# Patient Record
Sex: Male | Born: 1980 | Race: White | Hispanic: No | Marital: Married | Smoking: Former smoker
Health system: Southern US, Community
[De-identification: ages and names within clinical notes are randomized; demographics above are authoritative.]

## PROBLEM LIST (undated history)

## (undated) DIAGNOSIS — E669 Obesity, unspecified: Secondary | ICD-10-CM

## (undated) DIAGNOSIS — R7303 Prediabetes: Secondary | ICD-10-CM

## (undated) DIAGNOSIS — G4730 Sleep apnea, unspecified: Secondary | ICD-10-CM

## (undated) DIAGNOSIS — I10 Essential (primary) hypertension: Secondary | ICD-10-CM

## (undated) DIAGNOSIS — E291 Testicular hypofunction: Secondary | ICD-10-CM

## (undated) DIAGNOSIS — Z6841 Body Mass Index (BMI) 40.0 and over, adult: Secondary | ICD-10-CM

## (undated) DIAGNOSIS — R002 Palpitations: Secondary | ICD-10-CM

## (undated) DIAGNOSIS — E785 Hyperlipidemia, unspecified: Secondary | ICD-10-CM

## (undated) HISTORY — DX: Palpitations: R00.2

## (undated) HISTORY — PX: PLANTAR'S WART EXCISION: SHX2240

## (undated) HISTORY — DX: Essential (primary) hypertension: I10

## (undated) HISTORY — DX: Testicular hypofunction: E29.1

## (undated) HISTORY — DX: Prediabetes: R73.03

## (undated) HISTORY — DX: Sleep apnea, unspecified: G47.30

## (undated) HISTORY — DX: Morbid (severe) obesity due to excess calories: E66.01

## (undated) HISTORY — DX: Hyperlipidemia, unspecified: E78.5

## (undated) HISTORY — DX: Body Mass Index (BMI) 40.0 and over, adult: Z684

## (undated) HISTORY — PX: URETHRAL DILATION: SUR417

## (undated) HISTORY — DX: Obesity, unspecified: E66.9

---

## 2004-06-26 ENCOUNTER — Emergency Department (HOSPITAL_COMMUNITY): Admission: AC | Admit: 2004-06-26 | Discharge: 2004-06-27 | Payer: Self-pay

## 2004-07-15 ENCOUNTER — Ambulatory Visit: Payer: Self-pay

## 2006-05-30 ENCOUNTER — Other Ambulatory Visit: Payer: Self-pay

## 2006-05-30 ENCOUNTER — Emergency Department: Payer: Self-pay | Admitting: Unknown Physician Specialty

## 2007-10-06 ENCOUNTER — Ambulatory Visit: Payer: Self-pay | Admitting: Internal Medicine

## 2007-11-28 ENCOUNTER — Emergency Department: Payer: Self-pay | Admitting: Emergency Medicine

## 2008-09-08 HISTORY — PX: URETHROPLASTY: SHX499

## 2009-10-06 IMAGING — CT CT STONE STUDY
1 of 2 series · 16 of 32 positions shown, 20 images · non-contrast
Comparison: none

REASON FOR EXAM: Flank pain  -  mc2
COMMENTS:   LMP: (Male)

[Series 2: stone · axial · 0.77mm/px · z∈[-1075,-586]mm · 16 of 177 slices shown, 20 images]
[im 7/177  soft-tissue]
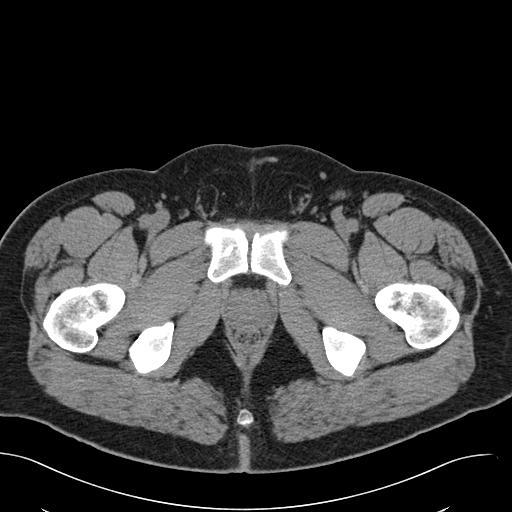
[im 7/177  bone]
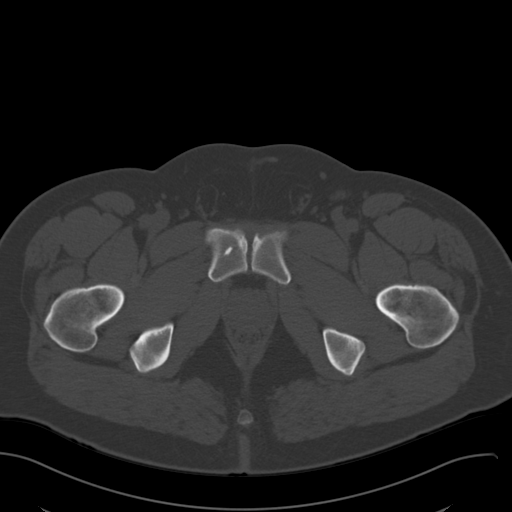
[im 19/177  soft-tissue]
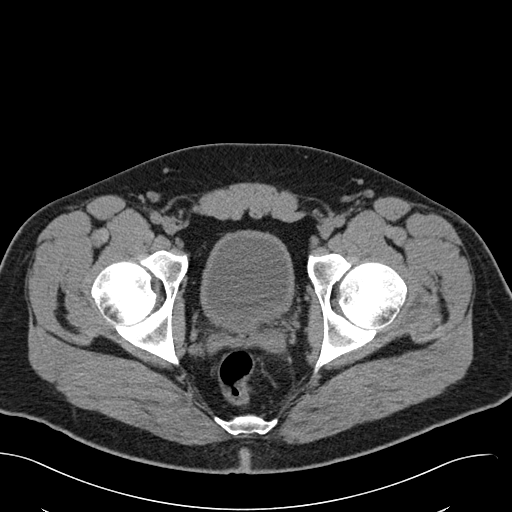
[im 32/177  soft-tissue]
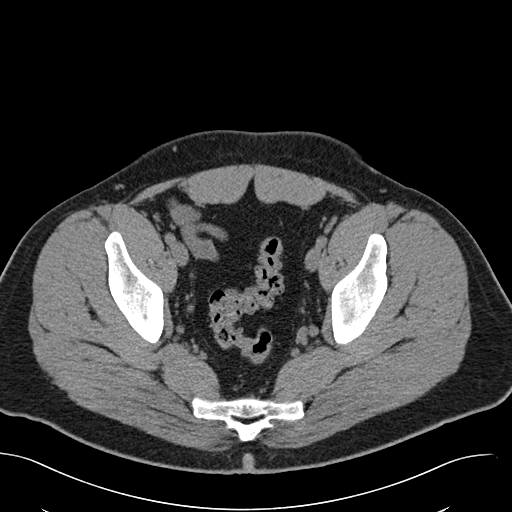
[im 45/177  soft-tissue]
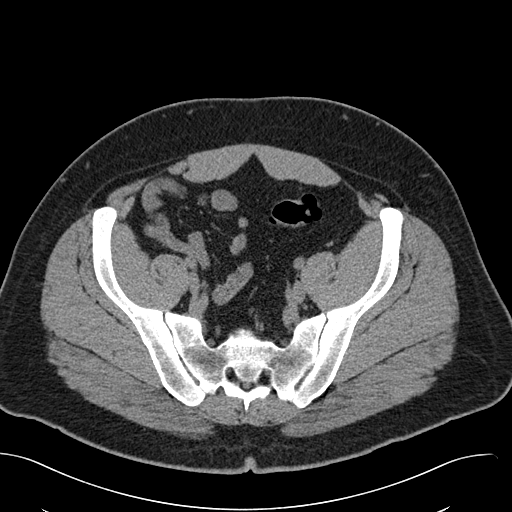
[im 57/177  soft-tissue]
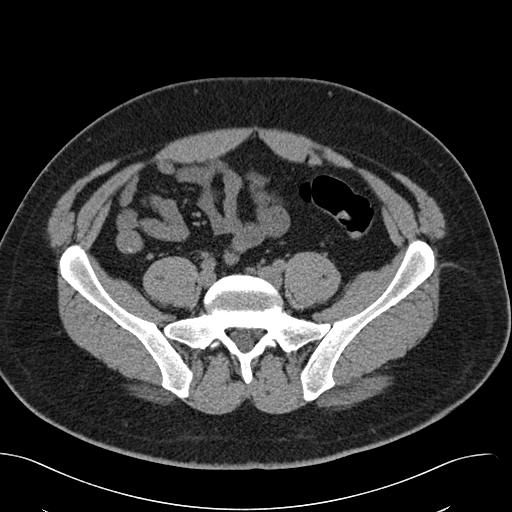
[im 70/177  soft-tissue]
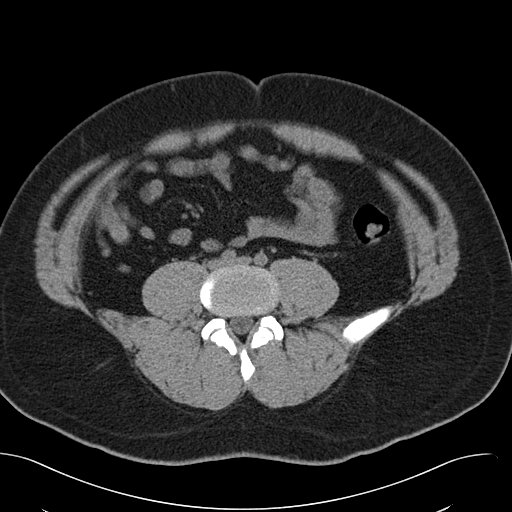
[im 82/177  soft-tissue]
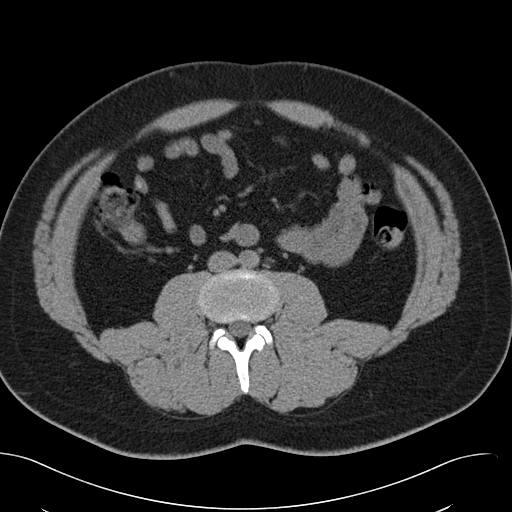
[im 95/177  soft-tissue]
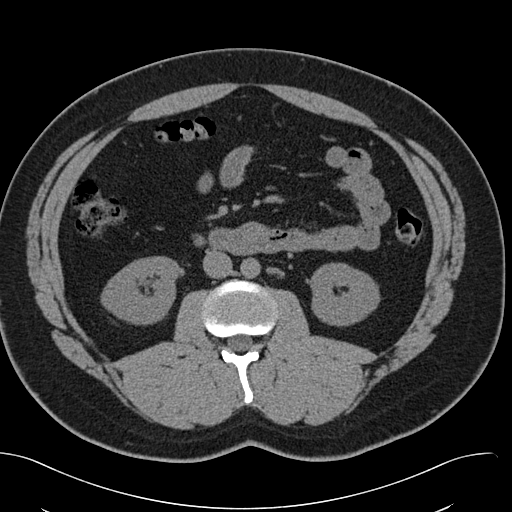
[im 107/177  soft-tissue]
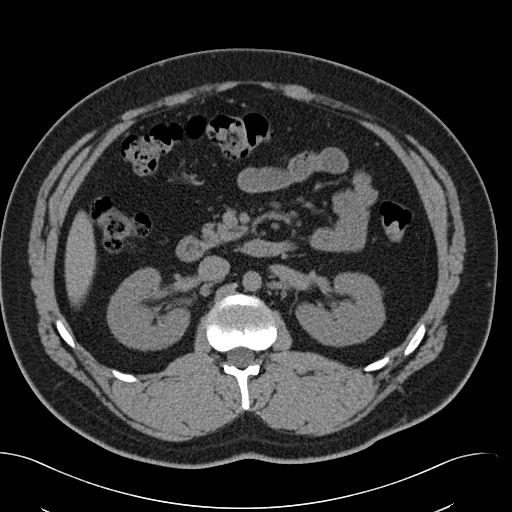
[im 107/177  bone]
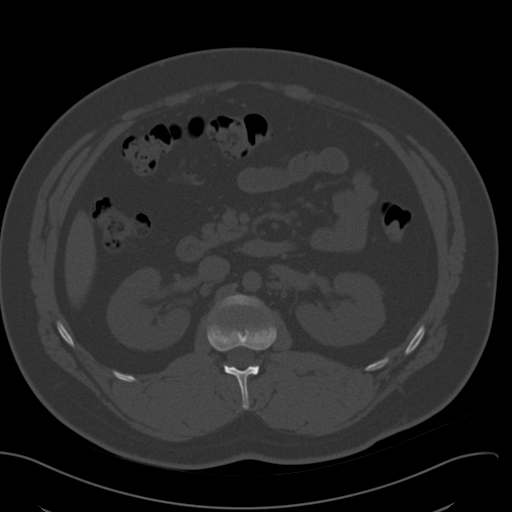
[im 120/177  soft-tissue]
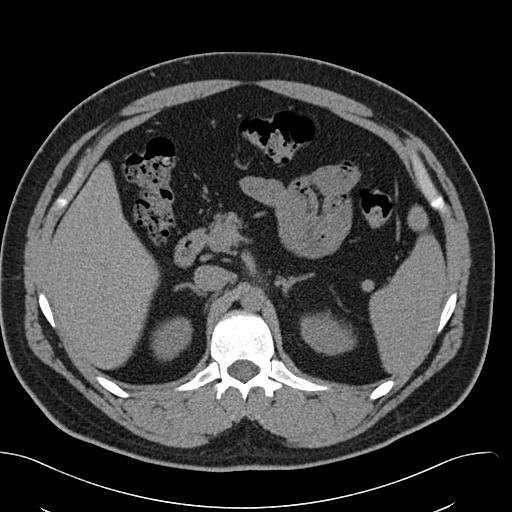
[im 133/177  soft-tissue]
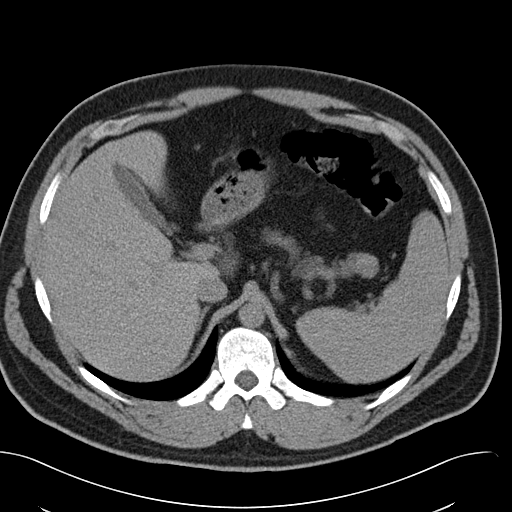
[im 145/177  soft-tissue]
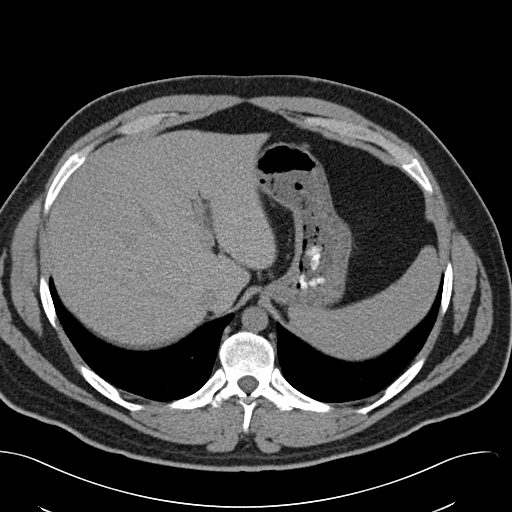
[im 151/177  lung]
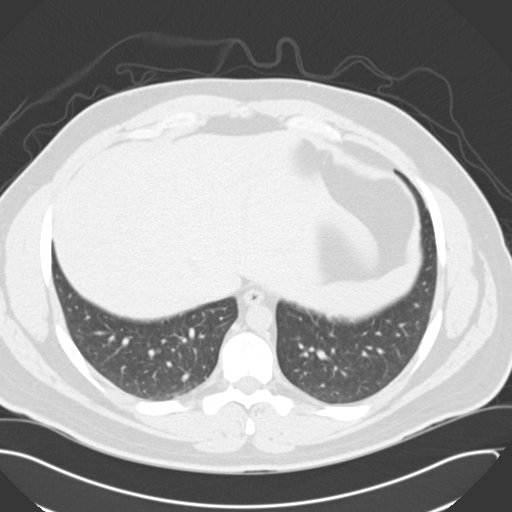
[im 158/177  soft-tissue]
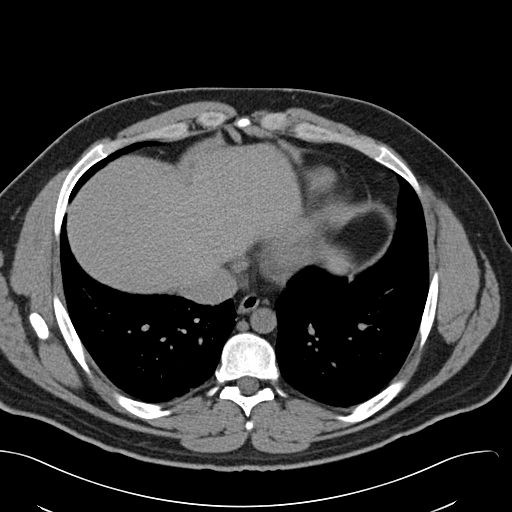
[im 158/177  lung]
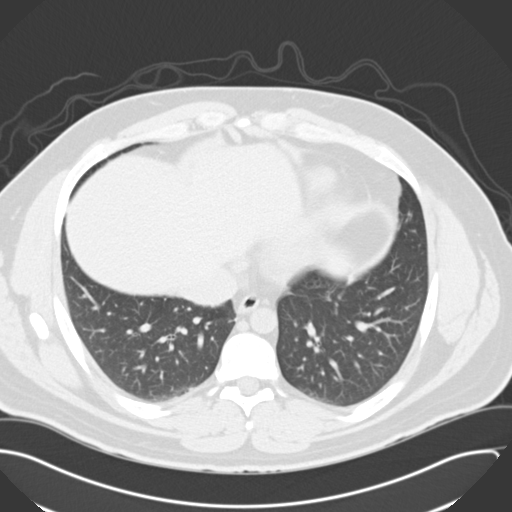
[im 164/177  lung]
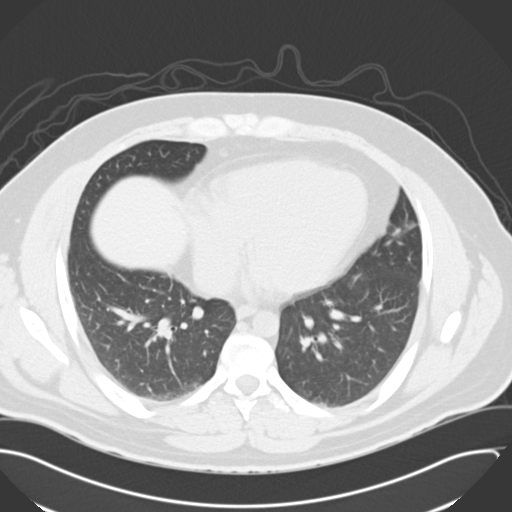
[im 170/177  soft-tissue]
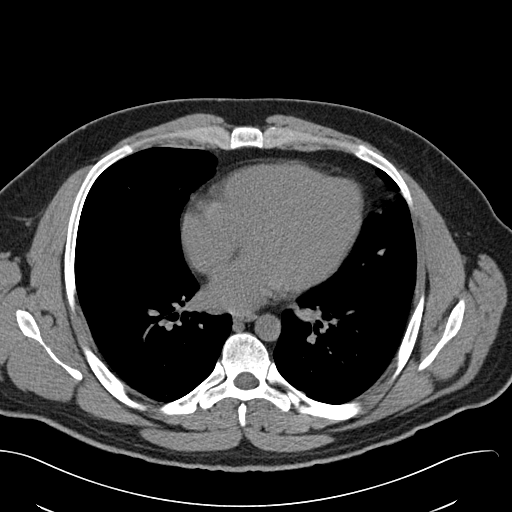
[im 170/177  lung]
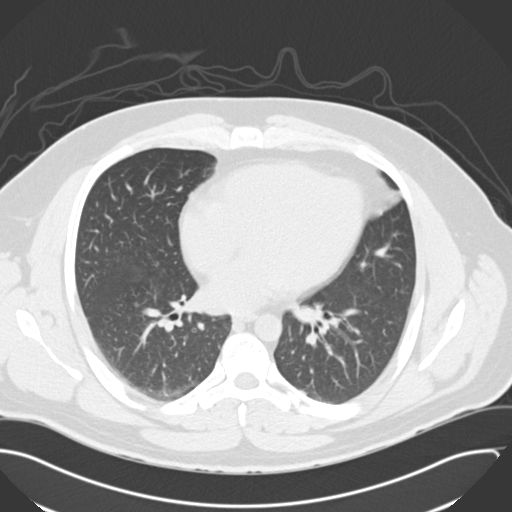

[16 of 32 positions shown; findings below may reference images not displayed]

PROCEDURE:     CT  - CT ABDOMEN /PELVIS WO (STONE)  - November 29, 2007  [DATE]

RESULT:     An emergent noncontrast CT of the abdomen and pelvis is
performed utilizing a renal stone protocol. There is no previous examination
for comparison.

Images through the base of the lungs demonstrate some dependent atelectasis
without other significant abnormality. The urinary bladder is not
significantly distended. There is no evidence of an acute inflammatory
process. The appendix is normal in appearance. The aorta is unremarkable.
There is no hydronephrosis or hydroureter. No stones are evident. The upper
abdominal viscera appear to be grossly normal for a noncontrast exam.
IMPRESSION: 1. No urinary tract calculi.
2. No acute inflammatory process.
3. The aorta is normal in caliber.

## 2014-03-01 ENCOUNTER — Emergency Department: Payer: Self-pay | Admitting: Emergency Medicine

## 2014-03-02 LAB — BASIC METABOLIC PANEL
Anion Gap: 8 (ref 7–16)
BUN: 18 mg/dL (ref 7–18)
Calcium, Total: 9.3 mg/dL (ref 8.5–10.1)
Chloride: 104 mmol/L (ref 98–107)
Co2: 26 mmol/L (ref 21–32)
Creatinine: 1.16 mg/dL (ref 0.60–1.30)
EGFR (African American): >60
EGFR (Non-African Amer.): >60
Glucose: 100 mg/dL — ABNORMAL HIGH (ref 65–99)
Osmolality: 278 (ref 275–301)
Potassium: 4.1 mmol/L (ref 3.5–5.1)
Sodium: 138 mmol/L (ref 136–145)

## 2014-03-02 LAB — CBC
HCT: 46.2 % (ref 40.0–52.0)
HGB: 14.8 g/dL (ref 13.0–18.0)
MCH: 28 pg (ref 26.0–34.0)
MCHC: 32.1 g/dL (ref 32.0–36.0)
MCV: 87 fL (ref 80–100)
Platelet: 216 10*3/uL (ref 150–440)
RBC: 5.29 10*6/uL (ref 4.40–5.90)
RDW: 13.4 % (ref 11.5–14.5)
WBC: 13.7 10*3/uL — ABNORMAL HIGH (ref 3.8–10.6)

## 2014-03-02 LAB — TROPONIN I
Troponin-I: <0.02 ng/mL
Troponin-I: <0.02 ng/mL

## 2015-08-29 ENCOUNTER — Encounter: Payer: Self-pay | Admitting: Primary Care

## 2015-09-04 ENCOUNTER — Ambulatory Visit (INDEPENDENT_AMBULATORY_CARE_PROVIDER_SITE_OTHER): Payer: Managed Care, Other (non HMO) | Admitting: Primary Care

## 2015-09-04 ENCOUNTER — Encounter: Payer: Self-pay | Admitting: Primary Care

## 2015-09-04 ENCOUNTER — Encounter (INDEPENDENT_AMBULATORY_CARE_PROVIDER_SITE_OTHER): Payer: Self-pay

## 2015-09-04 VITALS — BP 128/68 | HR 77 | Temp 98.0°F | Ht 71.25 in | Wt 323.2 lb

## 2015-09-04 DIAGNOSIS — E785 Hyperlipidemia, unspecified: Secondary | ICD-10-CM | POA: Diagnosis not present

## 2015-09-04 DIAGNOSIS — I10 Essential (primary) hypertension: Secondary | ICD-10-CM | POA: Diagnosis not present

## 2015-09-04 DIAGNOSIS — Z6841 Body Mass Index (BMI) 40.0 and over, adult: Secondary | ICD-10-CM | POA: Diagnosis not present

## 2015-09-04 LAB — HEMOGLOBIN A1C: Hgb A1c MFr Bld: 5.4 % (ref 4.6–6.5)

## 2015-09-04 LAB — LIPID PANEL
Cholesterol: 174 mg/dL (ref 0–200)
HDL: 35.2 mg/dL — ABNORMAL LOW (ref >39.00)
NonHDL: 138.62
Total CHOL/HDL Ratio: 5
Triglycerides: 366 mg/dL — ABNORMAL HIGH (ref 0.0–149.0)
VLDL: 73.2 mg/dL — ABNORMAL HIGH (ref 0.0–40.0)

## 2015-09-04 LAB — LDL CHOLESTEROL, DIRECT: Direct LDL: 95 mg/dL

## 2015-09-04 NOTE — Assessment & Plan Note (Signed)
Currently managed on atorvastatin 20 mg and Fish Oil. Lipid panel from July 2016 reviewed with trigs in 300's. Discussed importance of healthy diet and exercise. Emphasized that he must lose weight. Continue lipitor 20 mg and Fish Oil. Recheck lipids today.

## 2015-09-04 NOTE — Progress Notes (Signed)
Pre visit review using our clinic review tool, if applicable. No additional management support is needed unless otherwise documented below in the visit note. 

## 2015-09-04 NOTE — Assessment & Plan Note (Signed)
Currently managed on atenolol 25 mg and lisinopril 20 mg.  Interested in coming off BP meds, discussed importance of weight loss through healthy diet and exercise. BP stable in clinic today. Last BMP from records reviewed. Continue current regimen.

## 2015-09-04 NOTE — Assessment & Plan Note (Signed)
Poor diet, does not exercises. Works as a Naval architecttruck driver. Discussed the importance of a healthy diet and regular exercise in order for weight loss and to reduce risk of other medical diseases. Will continue to monitor.

## 2015-09-04 NOTE — Patient Instructions (Signed)
Complete lab work prior to leaving today. I will notify you of your results once received.   Start exercising. You should be getting 1 hour of moderate intensity exercise 5 days weekly.  It is important that you improve your diet. Please limit carbohydrates in the form of white bread, rice, pasta, fast food, sweets, fried foods, sugary drinks, etc. Increase your consumption of fresh fruits and vegetables.  You need to consume about 2 liters of water daily.  Follow up in July for your annual physical.   It was a pleasure to meet you today! Please don't hesitate to call me with any questions. Welcome to [facility]!   Food Choices to Lower Your Triglycerides Triglycerides are a type of fat in your blood. High levels of triglycerides can increase the risk of heart disease and stroke. If your triglyceride levels are high, the foods you eat and your eating habits are very important. Choosing the right foods can help lower your triglycerides.  WHAT GENERAL GUIDELINES DO I NEED TO FOLLOW?  Lose weight if you are overweight.   Limit or avoid alcohol.   Fill one half of your plate with vegetables and green salads.   Limit fruit to two servings a day. Choose fruit instead of juice.   Make one fourth of your plate whole grains. Look for the word "whole" as the first word in the ingredient list.  Fill one fourth of your plate with lean protein foods.  Enjoy fatty fish (such as salmon, mackerel, sardines, and tuna) three times a week.   Choose healthy fats.   Limit foods high in starch and sugar.  Eat more home-cooked food and less restaurant, buffet, and fast food.  Limit fried foods.  Cook foods using methods other than frying.  Limit saturated fats.  Check ingredient lists to avoid foods with partially hydrogenated oils (trans fats) in them. WHAT FOODS CAN I EAT?  Grains Whole grains, such as whole wheat or whole grain breads, crackers, cereals, and pasta. Unsweetened oatmeal,  bulgur, barley, quinoa, or brown rice. Corn or whole wheat flour tortillas.  Vegetables Fresh or frozen vegetables (raw, steamed, roasted, or grilled). Green salads. Fruits All fresh, canned (in natural juice), or frozen fruits. Meat and Other Protein Products Ground beef (85% or leaner), grass-fed beef, or beef trimmed of fat. Skinless chicken or [place]. Ground chicken or [place]. Pork trimmed of fat. All fish and seafood. Eggs. Dried beans, peas, or lentils. Unsalted nuts or seeds. Unsalted canned or dry beans. Dairy Low-fat dairy products, such as skim or 1% milk, 2% or reduced-fat cheeses, low-fat ricotta or cottage cheese, or plain low-fat yogurt. Fats and Oils Tub margarines without trans fats. Light or reduced-fat mayonnaise and salad dressings. Avocado. Safflower, olive, or canola oils. Natural peanut or almond butter. The items listed above may not be a complete list of recommended foods or beverages. Contact your dietitian for more options. WHAT FOODS ARE NOT RECOMMENDED?  Grains White bread. White pasta. White rice. Cornbread. Bagels, pastries, and croissants. Crackers that contain trans fat. Vegetables White potatoes. Corn. Creamed or fried vegetables. Vegetables in a cheese sauce. Fruits Dried fruits. Canned fruit in light or heavy syrup. Fruit juice. Meat and Other Protein Products Fatty cuts of meat. Ribs, chicken wings, bacon, sausage, bologna, salami, chitterlings, fatback, hot dogs, bratwurst, and packaged luncheon meats. Dairy Whole or 2% milk, cream, half-and-half, and cream cheese. Whole-fat or sweetened yogurt. Full-fat cheeses. Nondairy creamers and whipped toppings. Processed cheese, cheese spreads, or cheese curds.  Sweets and Desserts Corn syrup, sugars, honey, and molasses. Candy. Jam and jelly. Syrup. Sweetened cereals. Cookies, pies, cakes, donuts, muffins, and ice cream. Fats and Oils Butter, stick margarine, lard, shortening, ghee, or bacon fat. Coconut, palm  kernel, or palm oils. Beverages Alcohol. Sweetened drinks (such as sodas, lemonade, and fruit drinks or punches). The items listed above may not be a complete list of foods and beverages to avoid. Contact your dietitian for more information.   This information is not intended to replace advice given to you by your health care provider. Make sure you discuss any questions you have with your health care provider.   Document Released: 06/12/2004 Document Revised: 09/15/2014 Document Reviewed: 06/29/2013 Elsevier Interactive Patient Education [employer]vier Inc.

## 2015-09-04 NOTE — Progress Notes (Signed)
   Subjective:    Patient ID: Joe KaysJeffrey L Snyder, male    DOB: 03/14/1981, 34 y.o.   MRN: [id number]  HPI  Joe Snyder is a 34 year old male who presents today to establish care and discuss the problems mentioned below. Old records reviewed. His last physical was in July 2016.   1) Chest pain: Prior history with negative work up. Stress test and echocardiogram completed in 2015, reviewed per records, normal exam. Denies chest pain. History of obesity.   2) Hyperlipidemia: Currently managed on Lipitor 20 mg and Fish Oil. Last lipid panel was in July 2016 with Triglycerides in 300's. He started taking Fish Oil 1 week ago. He endorses a poor diet and does not regularly exercise.   3) Essential Hypertension: Currently managed on atenolol 25 mg and lisinopril 20 mg. His blood pressure is stable in the clinic today. History of prior tobacco abuse. Denies headaches, dizziness, chest pain.  Review of Systems  Constitutional: Negative for unexpected weight change.  HENT: Negative for rhinorrhea.   Respiratory: Negative for cough and shortness of breath.   Cardiovascular: Negative for chest pain.  Gastrointestinal: Negative for diarrhea and constipation.  Genitourinary: Negative for difficulty urinating.  Musculoskeletal: Negative for myalgias and arthralgias.  Skin: Negative for rash.  Allergic/Immunologic: Negative for environmental allergies.  Neurological: Negative for dizziness, numbness and headaches.  Psychiatric/Behavioral:       Denies concerns for anxiety or depression       Past Medical History  Diagnosis Date  . Hyperlipidemia   . Essential hypertension   . Testicular hypofunction   . Morbid obesity with BMI of 40.0-44.9, adult (HCC)   . Palpitations     Social History   Social History  . Marital Status: Married    Spouse Name: N/A  . Number of Children: N/A  . Years of Education: N/A   Occupational History  . Not on file.   Social History Main Topics  . Smoking  status: Former Games developermoker  . Smokeless tobacco: Never Used  . Alcohol Use: Yes  . Drug Use: No  . Sexual Activity: Yes   Other Topics Concern  . Not on file   Social History Narrative   Married.   3 children.   Works as a Naval architectTruck Driver   Enjoys riding motorcycles, spending time with family.     Past Surgical History  Procedure Laterality Date  . Plantar's wart excision    . Urethroplasty  2010    Family History  Problem Relation Age of Onset  . Hypertension Mother   . Alcohol abuse Maternal Grandfather     No Known Allergies  No current outpatient prescriptions on file prior to visit.   No current facility-administered medications on file prior to visit.    BP 128/68 mmHg  Pulse 77  Temp(Src) 98 F (36.7 C) (Oral)  Ht 5' 11.25" (1.81 m)  Wt 323 lb 4 oz (146.625 kg)  BMI 44.76 kg/m2  SpO2 96%    Objective:   Physical Exam  Constitutional: He is oriented to person, place, and time. He appears well-nourished.  Cardiovascular: Normal rate and regular rhythm.   Pulmonary/Chest: Effort normal and breath sounds normal.  Neurological: He is alert and oriented to person, place, and time.  Skin: Skin is warm and dry.  Psychiatric: He has a normal mood and affect.          Assessment & Plan:

## 2015-09-06 ENCOUNTER — Other Ambulatory Visit: Payer: Self-pay | Admitting: Primary Care

## 2015-09-06 DIAGNOSIS — E785 Hyperlipidemia, unspecified: Secondary | ICD-10-CM

## 2015-09-12 ENCOUNTER — Other Ambulatory Visit: Payer: Self-pay

## 2015-09-12 MED ORDER — ATORVASTATIN CALCIUM 20 MG PO TABS: 20.0000 mg | ORAL_TABLET | Freq: Every day | ORAL | Status: DC

## 2015-09-12 MED ORDER — LISINOPRIL 20 MG PO TABS: 20.0000 mg | ORAL_TABLET | Freq: Every day | ORAL | Status: DC

## 2015-09-12 MED ORDER — ATENOLOL 25 MG PO TABS: 25.0000 mg | ORAL_TABLET | Freq: Every day | ORAL | Status: DC

## 2015-09-12 NOTE — Telephone Encounter (Signed)
Pt request refill atenolol, atorvastatin and lisinopril to [facility]. Pt has CPX scheduled 07/02017. Advised pt refills done per protocol. Pt will ck with ph armacy.

## 2015-12-06 ENCOUNTER — Other Ambulatory Visit: Payer: Self-pay | Admitting: Primary Care

## 2015-12-06 ENCOUNTER — Other Ambulatory Visit (INDEPENDENT_AMBULATORY_CARE_PROVIDER_SITE_OTHER): Payer: Managed Care, Other (non HMO)

## 2015-12-06 DIAGNOSIS — E785 Hyperlipidemia, unspecified: Secondary | ICD-10-CM

## 2015-12-06 LAB — LIPID PANEL
Cholesterol: 166 mg/dL (ref 0–200)
HDL: 35.5 mg/dL — ABNORMAL LOW (ref >39.00)
NonHDL: 130.56
Total CHOL/HDL Ratio: 5
Triglycerides: 279 mg/dL — ABNORMAL HIGH (ref 0.0–149.0)
VLDL: 55.8 mg/dL — ABNORMAL HIGH (ref 0.0–40.0)

## 2015-12-06 LAB — LDL CHOLESTEROL, DIRECT: Direct LDL: 97 mg/dL

## 2015-12-06 MED ORDER — ATORVASTATIN CALCIUM 40 MG PO TABS: 40.0000 mg | ORAL_TABLET | Freq: Every day | ORAL | Status: DC

## 2015-12-07 ENCOUNTER — Ambulatory Visit (INDEPENDENT_AMBULATORY_CARE_PROVIDER_SITE_OTHER): Payer: Managed Care, Other (non HMO) | Admitting: Internal Medicine

## 2015-12-07 ENCOUNTER — Encounter: Payer: Self-pay | Admitting: Internal Medicine

## 2015-12-07 ENCOUNTER — Ambulatory Visit (INDEPENDENT_AMBULATORY_CARE_PROVIDER_SITE_OTHER)
Admission: RE | Admit: 2015-12-07 | Discharge: 2015-12-07 | Disposition: A | Discharge status: Home / Self Care | Payer: Managed Care, Other (non HMO) | Source: Ambulatory Visit | Attending: Internal Medicine | Admitting: Internal Medicine

## 2015-12-07 VITALS — BP 124/88 | HR 70 | Temp 97.4°F | Resp 10 | Wt 334.0 lb

## 2015-12-07 DIAGNOSIS — R079 Chest pain, unspecified: Secondary | ICD-10-CM

## 2015-12-07 MED ORDER — HYDROCODONE-HOMATROPINE 5-1.5 MG/5ML PO SYRP: 5.0000 mL | ORAL_SOLUTION | Freq: Every evening | ORAL | Status: DC | PRN

## 2015-12-07 NOTE — Assessment & Plan Note (Addendum)
Probably rib or soft tissue Will check CXR to be sure no pulmonary or pleura cause Some interstitial changes but no lobar pneumonia Will await overread--consider doxy if radiologist calls anything Cough syrup

## 2015-12-07 NOTE — Progress Notes (Signed)
   Subjective:    Patient ID: Joe KaysJeffrey L Baus, male    DOB: 05/11/1981, 35 y.o.   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 atenolol (TENORMIN) 25 MG tablet Take 1 tablet (25 mg total) by mouth daily. 30 tablet 6  . atorvastatin (LIPITOR) 40 MG tablet Take 1 tablet (40 mg total) by mouth daily. 30 tablet 3  . lisinopril (PRINIVIL,ZESTRIL) 20 MG tablet Take 1 tablet (20 mg total) by mouth daily. 30 tablet 6  . Multiple Vitamin (MULTIVITAMIN) capsule Take 2 capsules by mouth daily.    . Omega-3 Fatty Acids (FISH OIL PO) Take by mouth.    . vitamin E (VITAMIN E) 400 UNIT capsule Take 400 Units by mouth daily.     No current facility-administered medications on file prior to visit.    No Known Allergies  Past Medical History  Diagnosis Date  . Hyperlipidemia   . Essential hypertension   . Testicular hypofunction   . Morbid obesity with BMI of 40.0-44.9, adult (HCC)   . Palpitations     Past Surgical History  Procedure Laterality Date  . Plantar's wart excision    . Urethroplasty  2010    Family History  Problem Relation Age of Onset  . Hypertension Mother   . Alcohol abuse Maternal Grandfather     Social History   Social History  . Marital Status: Married    Spouse Name: N/A  . Number of Children: N/A  . Years of Education: N/A   Occupational History  . Not on file.   Social History Main Topics  . Smoking  status: Former Games developermoker  . Smokeless tobacco: Never Used  . Alcohol Use: Yes  . Drug Use: No  . Sexual Activity: Yes   Other Topics Concern  . Not on file   Social History Narrative   Married.   3 children.   Works as a Naval architectTruck Driver   Enjoys riding motorcycles, spending time with family.    Review of Systems Rash on left arm---?allergic No N/V or diarrhea Appetite is okay    Objective:   Physical Exam  Constitutional: He appears well-developed. No distress.  HENT:  Mouth/Throat: Oropharynx is clear and moist. No oropharyngeal exudate.  Neck: Normal range of motion. Neck supple. No thyromegaly present.  Pulmonary/Chest: Effort normal and breath sounds normal. No respiratory distress. He has no wheezes. He has no rales.  Tenderness along right ribs from anterior axillary line to near sternum ~T8-11  Lymphadenopathy:    He has no cervical adenopathy.          Assessment & Plan:

## 2015-12-07 NOTE — Progress Notes (Signed)
Pre visit review using our clinic review tool, if applicable. No additional management support is needed unless otherwise documented below in the visit note. 

## 2016-03-03 ENCOUNTER — Other Ambulatory Visit: Payer: Managed Care, Other (non HMO)

## 2016-03-10 ENCOUNTER — Encounter: Payer: Managed Care, Other (non HMO) | Admitting: Primary Care

## 2016-03-20 ENCOUNTER — Other Ambulatory Visit: Payer: Self-pay | Admitting: Primary Care

## 2016-03-20 DIAGNOSIS — E785 Hyperlipidemia, unspecified: Secondary | ICD-10-CM

## 2016-03-20 DIAGNOSIS — I10 Essential (primary) hypertension: Secondary | ICD-10-CM

## 2016-03-25 ENCOUNTER — Other Ambulatory Visit: Payer: Self-pay | Admitting: Primary Care

## 2016-03-28 ENCOUNTER — Other Ambulatory Visit (INDEPENDENT_AMBULATORY_CARE_PROVIDER_SITE_OTHER): Payer: Managed Care, Other (non HMO)

## 2016-03-28 DIAGNOSIS — I10 Essential (primary) hypertension: Secondary | ICD-10-CM

## 2016-03-28 DIAGNOSIS — E785 Hyperlipidemia, unspecified: Secondary | ICD-10-CM

## 2016-03-28 LAB — LIPID PANEL
Cholesterol: 152 mg/dL (ref 0–200)
HDL: 36.5 mg/dL — ABNORMAL LOW (ref >39.00)
NonHDL: 115.99
Total CHOL/HDL Ratio: 4
Triglycerides: 249 mg/dL — ABNORMAL HIGH (ref 0.0–149.0)
VLDL: 49.8 mg/dL — ABNORMAL HIGH (ref 0.0–40.0)

## 2016-03-28 LAB — COMPREHENSIVE METABOLIC PANEL
ALT: 61 U/L — ABNORMAL HIGH (ref 0–53)
AST: 36 U/L (ref 0–37)
Albumin: 4.3 g/dL (ref 3.5–5.2)
Alkaline Phosphatase: 59 U/L (ref 39–117)
BUN: 16 mg/dL (ref 6–23)
CO2: 29 mEq/L (ref 19–32)
Calcium: 9.6 mg/dL (ref 8.4–10.5)
Chloride: 105 mEq/L (ref 96–112)
Creatinine, Ser: 1.13 mg/dL (ref 0.40–1.50)
GFR: 78.51 mL/min (ref >60.00)
Glucose, Bld: 96 mg/dL (ref 70–99)
Potassium: 4.4 mEq/L (ref 3.5–5.1)
Sodium: 139 mEq/L (ref 135–145)
Total Bilirubin: 0.8 mg/dL (ref 0.2–1.2)
Total Protein: 7.4 g/dL (ref 6.0–8.3)

## 2016-03-28 LAB — HEMOGLOBIN A1C: Hgb A1c MFr Bld: 5.4 % (ref 4.6–6.5)

## 2016-03-28 LAB — LDL CHOLESTEROL, DIRECT: Direct LDL: 94 mg/dL

## 2016-03-31 ENCOUNTER — Encounter: Payer: Managed Care, Other (non HMO) | Admitting: Primary Care

## 2016-04-11 ENCOUNTER — Encounter: Payer: Self-pay | Admitting: Primary Care

## 2016-04-11 ENCOUNTER — Ambulatory Visit (INDEPENDENT_AMBULATORY_CARE_PROVIDER_SITE_OTHER): Payer: Managed Care, Other (non HMO) | Admitting: Primary Care

## 2016-04-11 VITALS — BP 136/84 | HR 80 | Temp 98.2°F | Ht 71.0 in | Wt 334.0 lb

## 2016-04-11 DIAGNOSIS — Z6841 Body Mass Index (BMI) 40.0 and over, adult: Secondary | ICD-10-CM

## 2016-04-11 DIAGNOSIS — Z0000 Encounter for general adult medical examination without abnormal findings: Secondary | ICD-10-CM | POA: Diagnosis not present

## 2016-04-11 DIAGNOSIS — Z1331 Encounter for screening for depression: Secondary | ICD-10-CM | POA: Insufficient documentation

## 2016-04-11 DIAGNOSIS — E785 Hyperlipidemia, unspecified: Secondary | ICD-10-CM | POA: Diagnosis not present

## 2016-04-11 DIAGNOSIS — I10 Essential (primary) hypertension: Secondary | ICD-10-CM

## 2016-04-11 NOTE — Assessment & Plan Note (Signed)
Long discussion today regarding his weight. He is eager to lose weight as he wants to come off his medications. Discussed healthy diet and what that entailed, handout provided with diet to lower triglycerides, commended him on his workouts and encouraged him to continue. Follow-up in 6 months for reevaluation of weight.

## 2016-04-11 NOTE — Patient Instructions (Signed)
Your triglycerides have improved, but are still too high. You must work to improve your diet by reducing fast food, fried food, junk food. Increase consumption of lean protein, vegetables, fruit, water.  Ensure you are consuming 64 ounces of water daily.  Continue exercising. You should be getting 1 hour of moderate intensity exercise 3-5 days weekly.  Follow up in 6 months for re-evaluation of weight and cholesterol. Ensure you come fasting.  It was a pleasure to see you today!  Food Choices to Lower Your Triglycerides Triglycerides are a type of fat in your blood. High levels of triglycerides can increase the risk of heart disease and stroke. If your triglyceride levels are high, the foods you eat and your eating habits are very important. Choosing the right foods can help lower your triglycerides.  WHAT GENERAL GUIDELINES DO I NEED TO FOLLOW?  Lose weight if you are overweight.   Limit or avoid alcohol.   Fill one half of your plate with vegetables and green salads.   Limit fruit to two servings a day. Choose fruit instead of juice.   Make one fourth of your plate whole grains. Look for the word "whole" as the first word in the ingredient list.  Fill one fourth of your plate with lean protein foods.  Enjoy fatty fish (such as salmon, mackerel, sardines, and tuna) three times a week.   Choose healthy fats.   Limit foods high in starch and sugar.  Eat more home-cooked food and less restaurant, buffet, and fast food.  Limit fried foods.  Cook foods using methods other than frying.  Limit saturated fats.  Check ingredient lists to avoid foods with partially hydrogenated oils (trans fats) in them. WHAT FOODS CAN I EAT?  Grains Whole grains, such as whole wheat or whole grain breads, crackers, cereals, and pasta. Unsweetened oatmeal, bulgur, barley, quinoa, or brown rice. Corn or whole wheat flour tortillas.  Vegetables Fresh or frozen vegetables (raw, steamed,  roasted, or grilled). Green salads. Fruits All fresh, canned (in natural juice), or frozen fruits. Meat and Other Protein Products Ground beef (85% or leaner), grass-fed beef, or beef trimmed of fat. Skinless chicken or [place]. Ground chicken or [place]. Pork trimmed of fat. All fish and seafood. Eggs. Dried beans, peas, or lentils. Unsalted nuts or seeds. Unsalted canned or dry beans. Dairy Low-fat dairy products, such as skim or 1% milk, 2% or reduced-fat cheeses, low-fat ricotta or cottage cheese, or plain low-fat yogurt. Fats and Oils Tub margarines without trans fats. Light or reduced-fat mayonnaise and salad dressings. Avocado. Safflower, olive, or canola oils. Natural peanut or almond butter. The items listed above may not be a complete list of recommended foods or beverages. Contact your dietitian for more options. WHAT FOODS ARE NOT RECOMMENDED?  Grains White bread. White pasta. White rice. Cornbread. Bagels, pastries, and croissants. Crackers that contain trans fat. Vegetables White potatoes. Corn. Creamed or fried vegetables. Vegetables in a cheese sauce. Fruits Dried fruits. Canned fruit in light or heavy syrup. Fruit juice. Meat and Other Protein Products Fatty cuts of meat. Ribs, chicken wings, bacon, sausage, bologna, salami, chitterlings, fatback, hot dogs, bratwurst, and packaged luncheon meats. Dairy Whole or 2% milk, cream, half-and-half, and cream cheese. Whole-fat or sweetened yogurt. Full-fat cheeses. Nondairy creamers and whipped toppings. Processed cheese, cheese spreads, or cheese curds. Sweets and Desserts Corn syrup, sugars, honey, and molasses. Candy. Jam and jelly. Syrup. Sweetened cereals. Cookies, pies, cakes, donuts, muffins, and ice cream. Fats and Oils Butter, stick  margarine, lard, shortening, ghee, or bacon fat. Coconut, palm kernel, or palm oils. Beverages Alcohol. Sweetened drinks (such as sodas, lemonade, and fruit drinks or punches). The items listed  above may not be a complete list of foods and beverages to avoid. Contact your dietitian for more information.   This information is not intended to replace advice given to you by your health care provider. Make sure you discuss any questions you have with your health care provider.   Document Released: 06/12/2004 Document Revised: 09/15/2014 Document Reviewed: 06/29/2013 Elsevier Interactive Patient Education [employer]

## 2016-04-11 NOTE — Progress Notes (Signed)
Subjective:    Patient ID: Joe Snyder, male    DOB: 1981-07-07, 35 y.o.   MRN: [id number]  HPI  Joe Snyder is a 35 year old male who presents today for complete physical.  Immunizations: -Tetanus: Completed in July 2016 -Influenza: Did complete last season   Diet: He endorses a poor diet. Breakfast: Fast food Lunch: [employer], fast food Dinner: Medical sales representative, junk food Snacks: Occasionally Desserts: None Beverages: Water, 1 can of soda every several days  Exercise: He's working out 5 days weekly for 1 hour. Eye exam: Complete's DOT physical annually Dental exam: Completes semi-anally   Wt Readings from Last 3 Encounters:  04/11/16 (!) 334 lb (151.5 kg)  12/07/15 (!) 334 lb (151.5 kg)  09/04/15 (!) 323 lb 4 oz (146.6 kg)      Review of Systems  Constitutional: Negative for unexpected weight change.  HENT: Negative for rhinorrhea.   Respiratory: Negative for cough and shortness of breath.   Cardiovascular: Negative for chest pain.  Gastrointestinal: Negative for constipation and diarrhea.  Genitourinary: Negative for difficulty urinating.  Musculoskeletal: Negative for arthralgias and myalgias.  Skin: Negative for rash.  Allergic/Immunologic: Negative for environmental allergies.  Neurological: Negative for dizziness, numbness and headaches.  Psychiatric/Behavioral:       Denies concerns for anxiety or depression       Past Medical History:  Diagnosis Date  . Essential hypertension   . Hyperlipidemia   . Morbid obesity with BMI of 40.0-44.9, adult (HCC)   . Palpitations   . Testicular hypofunction      Social History   Social History  . Marital status: Married    Spouse name: N/A  . Number of children: N/A  . Years of education: N/A   Occupational History  . Not on file.   Social History Main Topics  . Smoking status: Former Games developer  . Smokeless tobacco: Never Used  . Alcohol use Yes  . Drug use: No  . Sexual activity: Yes   Other Topics Concern    . Not on file   Social History Narrative   Married.   3 children.   Works as a Naval architect   Enjoys riding motorcycles, spending time with family.     Past Surgical History:  Procedure Laterality Date  . PLANTAR'S WART EXCISION    . URETHROPLASTY  2010    Family History  Problem Relation Age of Onset  . Hypertension Mother   . Alcohol abuse Maternal Grandfather     No Known Allergies  Current Outpatient Prescriptions on File Prior to Visit  Medication Sig Dispense Refill  . atenolol (TENORMIN) 25 MG tablet Take 1 tablet (25 mg total) by mouth daily. 30 tablet 6  . atorvastatin (LIPITOR) 40 MG tablet TAKE 1 TABLET (40 MG TOTAL) BY MOUTH DAILY. 30 tablet 0  . lisinopril (PRINIVIL,ZESTRIL) 20 MG tablet Take 1 tablet (20 mg total) by mouth daily. 30 tablet 6  . Multiple Vitamin (MULTIVITAMIN) capsule Take 2 capsules by mouth daily.    . Omega-3 Fatty Acids (FISH OIL PO) Take 1,000 mg by mouth.     . vitamin E (VITAMIN E) 400 UNIT capsule Take 400 Units by mouth daily.     No current facility-administered medications on file prior to visit.     BP 136/84   Pulse 80   Temp 98.2 F (36.8 C) (Oral)   Ht 5\' 11"  (1.803 m)   Wt (!) 334 lb (151.5 kg)   SpO2 98%  BMI 46.58 kg/m    Objective:   Physical Exam  Constitutional: He is oriented to person, place, and time. He appears well-nourished.  Morbid obesity  HENT:  Right Ear: Tympanic membrane and ear canal normal.  Left Ear: Tympanic membrane and ear canal normal.  Nose: Nose normal. Right sinus exhibits no maxillary sinus tenderness and no frontal sinus tenderness. Left sinus exhibits no maxillary sinus tenderness and no frontal sinus tenderness.  Mouth/Throat: Oropharynx is clear and moist.  Eyes: Conjunctivae and EOM are normal. Pupils are equal, round, and reactive to light.  Neck: Neck supple. Carotid bruit is not present. No thyromegaly present.  Cardiovascular: Normal rate, regular rhythm and normal heart  sounds.   Pulmonary/Chest: Effort normal and breath sounds normal. He has no wheezes. He has no rales.  Abdominal: Soft. Bowel sounds are normal. There is no tenderness.  Musculoskeletal: Normal range of motion.  Neurological: He is alert and oriented to person, place, and time. He has normal reflexes. No cranial nerve deficit.  Skin: Skin is warm and dry.  Psychiatric: He has a normal mood and affect.          Assessment & Plan:

## 2016-04-11 NOTE — Assessment & Plan Note (Signed)
Immunization up-to-date. Discussed the importance of a healthy diet and regular exercise in order for weight loss and to reduce risk of other medical diseases. Exam unremarkable. Labs with elevation in triglycerides, however improved since last lipid panel. Otherwise labs unremarkable.  Follow-up in 6 months for reevaluation of weight, one year for repeat physical.

## 2016-04-11 NOTE — Assessment & Plan Note (Addendum)
Overall stable with the exception of triglycerides which have improved since last lipid panel. Discussed continued use of fish oil and atorvastatin and heavy emphasis placed on weight loss through healthy diet and exercise. Recheck lipids in 6 months.

## 2016-04-11 NOTE — Progress Notes (Signed)
Pre visit review using our clinic review tool, if applicable. No additional management support is needed unless otherwise documented below in the visit note. 

## 2016-04-11 NOTE — Assessment & Plan Note (Signed)
Slightly above goal in clinic today, approved on recheck. Continue atenolol and lisinopril.

## 2016-04-15 ENCOUNTER — Other Ambulatory Visit: Payer: Self-pay | Admitting: Primary Care

## 2016-04-27 ENCOUNTER — Other Ambulatory Visit: Payer: Self-pay | Admitting: Primary Care

## 2016-05-02 ENCOUNTER — Telehealth: Payer: Self-pay | Admitting: Primary Care

## 2016-05-02 NOTE — Telephone Encounter (Signed)
PLEASE NOTE: All timestamps contained within this report are represented as [place] Time. CONFIDENTIALTY NOTICE: This fax transmission is intended only for the addressee. It contains information that is legally privileged, confidential or otherwise protected from use or disclosure. If you are not the intended recipient, you are strictly prohibited from reviewing, disclosing, copying using or disseminating any of this information or taking any action in reliance on or regarding this information. If you have received this fax in error, please notify us immediately by telephone so that we can arrange for its return to us. Phone: [phone], Toll-Free: [phone], Fax: [phone] Page: 1 of 2 Call Id: [id number] [facility] - Client TELEPHONE ADVICE RECORD [facility] Patient Name: Joe Snyder Gender: Male DOB: 01/06/1981 Age: [age] Y 21 D Return Phone Number: [phone] (Primary) Address: City/State/Zip: [place] Client [facility] Client Site [facility] Day Physician Vernona Riegerlark, Katherine - NP Contact Type Call Who Is Calling Patient / Member / Family / Caregiver Call Type Triage / Clinical Relationship To Patient Self Return Phone Number [phone] (Primary) Chief Complaint Cough Reason for Call Symptomatic / Request for Health Information Initial Comment Caller states he has cough and runny nose, out of town and wife was diagnosed with pneumonia Appointment Disposition EMR Appointment Not Necessary Info pasted into Epic Yes PreDisposition Did not know what to do Translation No Nurse Assessment Nurse: Debera Latalston, RN, Tinnie GensJeffrey Date/Time (Eastern Time): 05/02/2016 10:52:29 AM Confirm and document reason for call. If symptomatic, describe symptoms. You must click the next button to save text entered. ---Caller states he has cough and runny nose, out of town and wife was  diagnosed with pneumonia. Diagnosed with bacterial pneumonia. Symptoms started 2 days ago. Has the patient traveled out of the country within the last 30 days? ---No Does the patient have any new or worsening symptoms? ---Yes Will a triage be completed? ---Yes Related visit to physician within the last 2 weeks? ---No Does the PT have any chronic conditions? (i.e. diabetes, asthma, etc.) ---Yes List chronic conditions. ---HTN Is this a behavioral health or substance abuse call? ---No Guidelines Guideline Title Affirmed Question Affirmed Notes Nurse Date/Time Lamount Cohen(Eastern Time) Cough - Acute Productive Cough (all triage questions negative) Debera Latalston, RN, Tinnie GensJeffrey 05/02/2016 10:55:45 AM Disp. Time Lamount Cohen(Eastern Time) Disposition Final User 05/02/2016 10:49:23 AM Send To RN Personal Debera Latalston, RN, Tinnie GensJeffrey 05/02/2016 10:59:00 AM Home Care Yes Debera Latalston, RN, Tinnie GensJeffrey PLEASE NOTE: All timestamps contained within this report are represented as [place] Standard Time. CONFIDENTIALTY NOTICE: This fax transmission is intended only for the addressee. It contains information that is legally privileged, confidential or otherwise protected from use or disclosure. If you are not the intended recipient, you are strictly prohibited from reviewing, disclosing, copying using or disseminating any of this information or taking any action in reliance on or regarding this information. If you have received this fax in error, please notify us immediately by telephone so that we can arrange for its return to us. Phone: [phone], Toll-Free: [phone], Fax: [phone] Page: 2 of 2 Call Id: [id number] Caller Understands: Yes Disagree/Comply: Comply Care Advice Given Per Guideline HOME CARE: You should be able to treat this at home. REASSURANCE: * It doesn't sound like a serious cough. * Coughing up mucus is very important for protecting the lungs from pneumonia. COUGH MEDICINES: - OTC COUGH SYRUPS: The most common cough  suppressant in OTC cough medications is dextromethorphan. Often the letters 'DM'  appear in the name. - OTC COUGH DROPS: Cough drops can help a lot, especially for mild coughs. They reduce coughing by soothing your irritated throat and removing that tickle sensation in the back of the throat. Cough drops also have the advantage of portability - you can carry them with you. - HOME REMEDY - HARD CANDY: Hard candy works just as well as medicine-flavored OTC cough drops. Diabetics should use sugar-free candy. - HOME REMEDY - HONEY: This old home remedy has been shown to help decrease coughing at night. The adult dosage is 2 teaspoons (10 ml) at bedtime. Honey should not be given to infants under one year of age. EXPECTED COURSE: Viral bronchitis causes a cough for 1 to 3 weeks. Sometimes you may cough up lots of phlegm (mucus). The mucus can normally be white, gray, yellow or green. * This will help soothe an irritated or dry throat and loosen up the phlegm. * Drink adequate liquids. PREVENT DEHYDRATION: HUMIDIFIER: If the air is dry, use a humidifier in the bedroom. (Reason: dry air makes coughs worse) OTC COUGH SYRUP - DEXTROMETHORPHAN: * Cough syrups containing the cough suppressant dextromethorphan (DM) may help decrease your cough. Cough syrups work best for coughs that keep you awake at night. They can also sometimes help in the late stages of a respiratory infection when the cough is dry and hacking. They can be used along with cough drops. * Examples: Benylin, Robitussin DM, Vicks 44 Cough Relief * Read the package instructions for dosage, contraindications, and other important information. CAUTION - DEXTROMETHORPHAN: * Do not try to completely suppress coughs that produce mucus and phlegm. Remember that coughing is helpful in bringing up mucus from the lungs and preventing pneumonia. * Research Notes: Dextromethorphan in some research studies has been shown to reduce the frequency and severity of  cough in adults (18 years or older) without significant adverse effects. However, other studies suggest that dextromethorphan is no better than placebo at reducing a cough. - Drug Abuse Potential: It should be noted that dextromethorphan has become a drug of abuse. This problem is seen most often in adolescents. Overdose symptoms can range from giggling and euphoria to hallucinations and coma. * CONTRAINDICATED: Do not take dextromethorphan if you are taking a monoamine oxidase (MAO) inhibitor now or in the past 2 weeks. Examples of MAO inhibitors include isocarboxazid (Marplan), phenelzine (Nardil), selegiline (Eldepryl, Emsam, Zelapar), and tranylcypromine (Parnate). Do not take dextromethorphan if you are taking venlafaxine (Effexor). CALL BACK IF: * Cough lasts over 3 weeks * Continuous coughing persists over 2 hours after cough treatment * Difficulty breathing occurs * Fever over 103 F (39.4 C) * Fever lasts over 3 days * You become worse. CARE ADVICE given per Cough - Acute Productive (Adult) guideline.

## 2016-05-02 NOTE — Telephone Encounter (Signed)
Noted and agree. Please call patient Monday and check on symptoms.

## 2016-05-02 NOTE — Telephone Encounter (Signed)
[facility] - Client TELEPHONE ADVICE RECORD [facility] Patient Name: Joe BoroughsJEFFREY Perrott DOB: 01/19/1981 Initial Comment Caller states he has cough and runny nose, out of town and wife was diagnosed with pneumonia Nurse Assessment Nurse: Debera Latalston, RN, Tinnie GensJeffrey Date/Time (Eastern Time): 05/02/2016 10:52:29 AM Confirm and document reason for call. If symptomatic, describe symptoms. You must click the next button to save text entered. ---Caller states he has cough and runny nose, out of town and wife was diagnosed with pneumonia. Diagnosed with bacterial pneumonia. Symptoms started 2 days ago. Has the patient traveled out of the country within the last 30 days? ---No Does the patient have any new or worsening symptoms? ---Yes Will a triage be completed? ---Yes Related visit to physician within the last 2 weeks? ---No Does the PT have any chronic conditions? (i.e. diabetes, asthma, etc.) ---Yes List chronic conditions. ---HTN Is this a behavioral health or substance abuse call? ---No Guidelines Guideline Title Affirmed Question Affirmed Notes Cough - Acute Productive Cough (all triage questions negative) Final Disposition User Home Care Debera Latalston, RN, Tinnie GensJeffrey Disagree/Comply: Comply

## 2016-05-05 NOTE — Telephone Encounter (Signed)
Spoken to patient and he stated that he is feeling much better. Patient is no longer having any runny nose. Patient still have a little cough but not like it was before.

## 2016-05-05 NOTE — Telephone Encounter (Signed)
Noted and glad to hear! 

## 2016-05-07 ENCOUNTER — Ambulatory Visit (INDEPENDENT_AMBULATORY_CARE_PROVIDER_SITE_OTHER): Payer: Managed Care, Other (non HMO) | Admitting: Primary Care

## 2016-05-07 ENCOUNTER — Encounter: Payer: Self-pay | Admitting: Primary Care

## 2016-05-07 VITALS — BP 134/78 | HR 67 | Temp 98.0°F | Ht 71.0 in | Wt 337.8 lb

## 2016-05-07 DIAGNOSIS — R490 Dysphonia: Secondary | ICD-10-CM | POA: Diagnosis not present

## 2016-05-07 NOTE — Progress Notes (Signed)
Pre visit review using our clinic review tool, if applicable. No additional management support is needed unless otherwise documented below in the visit note. 

## 2016-05-07 NOTE — Patient Instructions (Signed)
Your symptoms will improve over the next several days.  You can try taking an antihistamine such as Zyrtec at bedtime to help dry up fluid that may be causing popping and ringing in the ears.  Please notify me if you develop persistent fevers of 101, start coughing up green mucous, notice increased fatigue or weakness, or feel worse.  Increase consumption of water intake and rest.  It was a pleasure to see you today!   Hoarseness Hoarseness is any abnormal change in your voice.Hoarseness can make it difficult to speak. Your voice may sound raspy, breathy, or strained. Hoarseness is caused by a problem with the vocal cords. The vocal cords are two bands of tissue inside your voice box (larynx). When you speak, your vocal cords move back and forth to create sound. The surfaces of your vocal cords need to be smooth for your voice to sound clear. Swelling or lumps on the vocal cords can cause hoarseness. Common causes of vocal cord problems include:  Upper airway infection.  A long-term cough.  Straining or overusing your voice.  Smoking.  Allergies.  Vocal cord growths.  Stomach acids that flow up from your stomach and irritate your vocal cords (gastroesophageal reflux). HOME CARE INSTRUCTIONS Watch your condition for any changes. To ease any discomfort that you feel:  Rest your voice. Do not whisper. Whispering can cause muscle strain.  Do not speak in a loud or harsh voice that makes your hoarseness worse.  Do not use any tobacco products, including cigarettes, chewing tobacco, or electronic cigarettes. If you need help quitting, ask your health care provider.  Avoid secondhand smoke.  Do not eat foods that give you heartburn. Heartburn can make gastroesophageal reflux worse.  Do not drink coffee.  Do not drink alcohol.  Drink enough fluids to keep your urine clear or pale yellow.  Use a humidifier if the air in your home is dry. SEEK MEDICAL CARE IF:  You have  hoarseness that lasts longer than 3 weeks.  You almost lose or completelylose your voice for longer than 3 days.  You have pain when you swallow or try to talk.  You feel a lump in your neck. SEEK IMMEDIATE MEDICAL CARE IF:  You have trouble swallowing.  You feel as though you are choking when you swallow.  You cough up blood or vomit blood.  You have trouble breathing.   This information is not intended to replace advice given to you by your health care provider. Make sure you discuss any questions you have with your health care provider.   Document Released: 08/08/2005 Document Revised: 01/09/2015 Document Reviewed: 08/16/2014 Elsevier Interactive Patient Education [employer]2016 Elsevier Inc.

## 2016-05-07 NOTE — Progress Notes (Signed)
Subjective:    Patient ID: Joe KaysJeffrey L Snyder, male    DOB: 07/11/1981, 35 y.o.   MRN: [id number]  HPI  Joe Snyder is a 35 year old male who presents today with a chief complaint of voice hoarseness. He also reports nasal congestion, shortness of breath and mild cough. His wife was diagnosed and treated for pneumonia last week. Denies fevers, wheezing, chest pain.   Last week he experienced rhinnorhea, cough, body aches, but those symptoms have since improved. He took robitussin and therafu last week with improvement. He is concerned today because he woke up today with a hoarse voice.  He denies personal hisoty of pneumonia. Overall he is feeling much improved.  Review of Systems  Constitutional: Positive for chills. Negative for fatigue and fever.  HENT: Positive for voice change. Negative for congestion.   Respiratory: Positive for cough and shortness of breath. Negative for wheezing.        Past Medical History:  Diagnosis Date  . Essential hypertension   . Hyperlipidemia   . Morbid obesity with BMI of 40.0-44.9, adult (HCC)   . Palpitations   . Testicular hypofunction      Social History   Social History  . Marital status: Married    Spouse name: N/A  . Number of children: N/A  . Years of education: N/A   Occupational History  . Not on file.   Social History Main Topics  . Smoking status: Former Games developermoker  . Smokeless tobacco: Never Used  . Alcohol use Yes  . Drug use: No  . Sexual activity: Yes   Other Topics Concern  . Not on file   Social History Narrative   Married.   3 children.   Works as a Naval architectTruck Driver   Enjoys riding motorcycles, spending time with family.     Past Surgical History:  Procedure Laterality Date  . PLANTAR'S WART EXCISION    . URETHROPLASTY  2010    Family History  Problem Relation Age of Onset  . Hypertension Mother   . Alcohol abuse Maternal Grandfather     No Known Allergies  Current Outpatient Prescriptions on File Prior  to Visit  Medication Sig Dispense Refill  . atenolol (TENORMIN) 25 MG tablet TAKE 1 TABLET BY MOUTH EVERY DAY 30 tablet 5  . atorvastatin (LIPITOR) 40 MG tablet TAKE 1 TABLET (40 MG TOTAL) BY MOUTH DAILY. 30 tablet 5  . lisinopril (PRINIVIL,ZESTRIL) 20 MG tablet TAKE 1 TABLET BY MOUTH EVERY DAY 30 tablet 5  . Multiple Vitamin (MULTIVITAMIN) capsule Take 2 capsules by mouth daily.    . Omega-3 Fatty Acids (FISH OIL PO) Take 1,000 mg by mouth.     . vitamin E (VITAMIN E) 400 UNIT capsule Take 400 Units by mouth daily.     No current facility-administered medications on file prior to visit.     BP 134/78   Pulse 67   Temp 98 F (36.7 C) (Oral)   Ht 5\' 11"  (1.803 m)   Wt (!) 337 lb 12.8 oz (153.2 kg)   SpO2 97%   BMI 47.11 kg/m    Objective:   Physical Exam  Constitutional: He appears well-nourished. He does not appear ill.  HENT:  Right Ear: Tympanic membrane and ear canal normal.  Left Ear: Tympanic membrane and ear canal normal.  Nose: No mucosal edema. Right sinus exhibits no maxillary sinus tenderness and no frontal sinus tenderness. Left sinus exhibits no maxillary sinus tenderness and no frontal sinus tenderness.  Mouth/Throat: Oropharynx is clear and moist.  Eyes: Conjunctivae are normal.  Neck: Neck supple.  Cardiovascular: Normal rate and regular rhythm.   Pulmonary/Chest: Effort normal and breath sounds normal. He has no wheezes. He has no rales.  Skin: Skin is warm and dry.          Assessment & Plan:  Voice hoarseness:  Viral URI last week, overall feeling much improved. Mostly concern regarding changes in voice today. Exam today unremarkable and without evidence of bacterial involvement. Vital signs stable and does not appear ill. Reassurance provided that symptoms will improve over the next several days. Discussed voice rest, warm salt gargles, warm tea. Also discussed antihistamine for postnasal drip. Return precautions provided.  Morrie Sheldon, NP

## 2016-10-11 ENCOUNTER — Other Ambulatory Visit: Payer: Self-pay | Admitting: Primary Care

## 2016-10-11 DIAGNOSIS — I10 Essential (primary) hypertension: Secondary | ICD-10-CM

## 2016-10-13 ENCOUNTER — Ambulatory Visit: Payer: Managed Care, Other (non HMO) | Admitting: Primary Care

## 2016-10-13 NOTE — Telephone Encounter (Signed)
Ok to refill? Electronically refill request for atenolol (TENORMIN) 25 MG tablet #30 with 5 refills  lisinopril (PRINIVIL,ZESTRIL) 20 MG tablet #30 with 5 refills  Last prescribed on 04/15/2016. Last seen on 05/07/2016.

## 2016-11-08 ENCOUNTER — Other Ambulatory Visit: Payer: Self-pay | Admitting: Primary Care

## 2016-12-08 ENCOUNTER — Other Ambulatory Visit: Payer: Self-pay | Admitting: Primary Care

## 2017-01-13 ENCOUNTER — Ambulatory Visit: Payer: Managed Care, Other (non HMO) | Admitting: Primary Care

## 2017-01-13 ENCOUNTER — Other Ambulatory Visit: Payer: Self-pay | Admitting: Primary Care

## 2017-02-12 ENCOUNTER — Other Ambulatory Visit: Payer: Self-pay | Admitting: Primary Care

## 2017-03-15 ENCOUNTER — Other Ambulatory Visit: Payer: Self-pay | Admitting: Primary Care

## 2017-03-23 ENCOUNTER — Other Ambulatory Visit: Payer: Self-pay | Admitting: *Deleted

## 2017-03-23 DIAGNOSIS — E785 Hyperlipidemia, unspecified: Secondary | ICD-10-CM

## 2017-03-23 MED ORDER — ATORVASTATIN CALCIUM 40 MG PO TABS: 40.0000 mg | ORAL_TABLET | Freq: Every day | ORAL | 0 refills | Status: DC

## 2017-03-23 NOTE — Telephone Encounter (Signed)
Noted, 30 day supply sent to [facility].

## 2017-03-23 NOTE — Telephone Encounter (Signed)
Patient left a voicemail stating that he has scheduled his physical for 04/13/17 and needs enough of his cholesterol medication to last him until that appointment. Patient requested a call back when this has been done.

## 2017-03-23 NOTE — Telephone Encounter (Signed)
Patient notified by telephone that script has been sent to pharmacy per Mayra ReelKate Clark NP.

## 2017-03-27 ENCOUNTER — Ambulatory Visit: Payer: Managed Care, Other (non HMO) | Admitting: Primary Care

## 2017-04-11 ENCOUNTER — Other Ambulatory Visit: Payer: Self-pay | Admitting: Primary Care

## 2017-04-11 DIAGNOSIS — I10 Essential (primary) hypertension: Secondary | ICD-10-CM

## 2017-04-13 ENCOUNTER — Ambulatory Visit (INDEPENDENT_AMBULATORY_CARE_PROVIDER_SITE_OTHER): Payer: Managed Care, Other (non HMO) | Admitting: Primary Care

## 2017-04-13 ENCOUNTER — Encounter: Payer: Self-pay | Admitting: Primary Care

## 2017-04-13 VITALS — BP 130/80 | HR 73 | Temp 98.3°F | Ht 71.0 in | Wt 345.4 lb

## 2017-04-13 DIAGNOSIS — Z6841 Body Mass Index (BMI) 40.0 and over, adult: Secondary | ICD-10-CM

## 2017-04-13 DIAGNOSIS — Z0000 Encounter for general adult medical examination without abnormal findings: Secondary | ICD-10-CM | POA: Diagnosis not present

## 2017-04-13 DIAGNOSIS — I10 Essential (primary) hypertension: Secondary | ICD-10-CM

## 2017-04-13 DIAGNOSIS — E785 Hyperlipidemia, unspecified: Secondary | ICD-10-CM | POA: Diagnosis not present

## 2017-04-13 MED ORDER — ATENOLOL 25 MG PO TABS: 25.0000 mg | ORAL_TABLET | Freq: Every day | ORAL | 3 refills | Status: DC

## 2017-04-13 MED ORDER — LISINOPRIL 20 MG PO TABS: 20.0000 mg | ORAL_TABLET | Freq: Every day | ORAL | 3 refills | Status: DC

## 2017-04-13 MED ORDER — ATORVASTATIN CALCIUM 40 MG PO TABS: 40.0000 mg | ORAL_TABLET | Freq: Every evening | ORAL | 3 refills | Status: DC

## 2017-04-13 NOTE — Progress Notes (Signed)
Subjective:    Patient ID: Joe Snyder, male    DOB: 08/30/1981, 36 y.o.   MRN: [id number]  HPI  Joe Snyder is a 36 year old male who presents today for complete physical.  Immunizations: -Tetanus: Completed in 2016. -Influenza: Did not complete last season.   Diet: Endorses a poor diet. Breakfast: Diner food Lunch: Left overs, Fast food Dinner: Meat, vegetables, potatoes, pizza Snacks: None Desserts: Occasional candy bar Beverages: Water with flavor packs, occasional soda, occasional alcohol   Exercise: He does not currently exercise.  Eye exam: Not completed recently. Has noticed some near sightedness.  Dental exam: Completes semi-annually   Review of Systems  Constitutional: Negative for unexpected weight change.  HENT: Negative for rhinorrhea.   Respiratory: Negative for cough and shortness of breath.   Cardiovascular: Negative for chest pain.  Gastrointestinal: Negative for constipation and diarrhea.  Genitourinary: Negative for difficulty urinating.  Musculoskeletal: Negative for arthralgias and myalgias.  Skin: Negative for rash.  Allergic/Immunologic: Negative for environmental allergies.  Neurological: Negative for dizziness, numbness and headaches.  Psychiatric/Behavioral:       Some stress with new occupation, denies depression       Past Medical History:  Diagnosis Date  . Essential hypertension   . Hyperlipidemia   . Morbid obesity with BMI of 40.0-44.9, adult (HCC)   . Palpitations   . Testicular hypofunction      Social History   Social History  . Marital status: Married    Spouse name: N/A  . Number of children: N/A  . Years of education: N/A   Occupational History  . Not on file.   Social History Main Topics  . Smoking status: Former Games developer  . Smokeless tobacco: Never Used  . Alcohol use Yes  . Drug use: No  . Sexual activity: Yes   Other Topics Concern  . Not on file   Social History Narrative   Married.   3 children.    Works as a Naval architect   Enjoys riding motorcycles, spending time with family.     Past Surgical History:  Procedure Laterality Date  . PLANTAR'S WART EXCISION    . URETHROPLASTY  2010    Family History  Problem Relation Age of Onset  . Hypertension Mother   . Alcohol abuse Maternal Grandfather     No Known Allergies  Current Outpatient Prescriptions on File Prior to Visit  Medication Sig Dispense Refill  . atenolol (TENORMIN) 25 MG tablet TAKE 1 TABLET BY MOUTH EVERY DAY 90 tablet 1  . atorvastatin (LIPITOR) 40 MG tablet Take 1 tablet (40 mg total) by mouth daily at 6 PM. MUST SCHEDULE FOLLOW UP OFFICE VISIT 30 tablet 0  . lisinopril (PRINIVIL,ZESTRIL) 20 MG tablet TAKE 1 TABLET BY MOUTH EVERY DAY 90 tablet 1  . Multiple Vitamin (MULTIVITAMIN) capsule Take 2 capsules by mouth daily.    . Omega-3 Fatty Acids (FISH OIL PO) Take 1,000 mg by mouth.     . vitamin E (VITAMIN E) 400 UNIT capsule Take 400 Units by mouth daily.     No current facility-administered medications on file prior to visit.     BP 130/80   Pulse 73   Temp 98.3 F (36.8 C) (Oral)   Ht 5\' 11"  (1.803 m)   Wt (!) 345 lb 6.4 oz (156.7 kg)   SpO2 96%   BMI 48.17 kg/m    Objective:   Physical Exam  Constitutional: He is oriented to person, place, and  time. He appears well-nourished.  HENT:  Right Ear: Tympanic membrane and ear canal normal.  Left Ear: Tympanic membrane and ear canal normal.  Nose: Nose normal. Right sinus exhibits no maxillary sinus tenderness and no frontal sinus tenderness. Left sinus exhibits no maxillary sinus tenderness and no frontal sinus tenderness.  Mouth/Throat: Oropharynx is clear and moist.  Eyes: Pupils are equal, round, and reactive to light. Conjunctivae and EOM are normal.  Neck: Neck supple. Carotid bruit is not present. No thyromegaly present.  Cardiovascular: Normal rate, regular rhythm and normal heart sounds.   Pulmonary/Chest: Effort normal and breath sounds  normal. He has no wheezes. He has no rales.  Abdominal: Soft. Bowel sounds are normal. There is no tenderness.  Musculoskeletal: Normal range of motion.  Neurological: He is alert and oriented to person, place, and time. He has normal reflexes. No cranial nerve deficit.  Skin: Skin is warm and dry.  Psychiatric: He has a normal mood and affect.          Assessment & Plan:

## 2017-04-13 NOTE — Assessment & Plan Note (Signed)
Stable in the office today, continue current regimen. BMP pending. 

## 2017-04-13 NOTE — Patient Instructions (Addendum)
Complete lab work prior to leaving today. I will notify you of your results once received.   Start exercising. You should be getting 150 minutes of moderate intensity exercise weekly.  It's important to improve your diet by reducing consumption of fast food, fried food, processed snack foods, sugary drinks. Increase consumption of fresh vegetables and fruits, whole grains, water.  Ensure you are drinking 64 ounces of water daily.  Ensure you are consuming 64 ounces of water daily.  Follow up in 1 year for your annual exam or sooner if needed.  It was a pleasure to see you today!

## 2017-04-13 NOTE — Assessment & Plan Note (Signed)
Immunizations UTD. Discussed the importance of a healthy diet and regular exercise in order for weight loss, and to reduce the risk of other medical problems. Exam unremarkable. Labs pending. Follow up in 1 year for your annual exam.

## 2017-04-13 NOTE — Assessment & Plan Note (Signed)
Long discussion today regarding the importance of improvements in diet and regular exercise. Information provided.

## 2017-04-13 NOTE — Assessment & Plan Note (Signed)
Due for repeat lipid panel today. Continue atorvastatin.

## 2017-04-14 ENCOUNTER — Other Ambulatory Visit: Payer: Self-pay | Admitting: Primary Care

## 2017-04-14 DIAGNOSIS — E785 Hyperlipidemia, unspecified: Secondary | ICD-10-CM

## 2017-04-14 DIAGNOSIS — I10 Essential (primary) hypertension: Secondary | ICD-10-CM

## 2017-04-14 LAB — HEMOGLOBIN A1C
Hgb A1c MFr Bld: 5.3 % (ref <5.7)
MEAN PLASMA GLUCOSE: 105 mg/dL

## 2017-04-14 LAB — LIPID PANEL
CHOL/HDL RATIO: 4.6 ratio (ref <5.0)
CHOLESTEROL: 162 mg/dL (ref <200)
HDL: 35 mg/dL — ABNORMAL LOW (ref >40)
Triglycerides: 432 mg/dL — ABNORMAL HIGH (ref <150)

## 2017-04-14 LAB — COMPREHENSIVE METABOLIC PANEL
ALT: 39 U/L (ref 9–46)
AST: 25 U/L (ref 10–40)
Albumin: 4.3 g/dL (ref 3.6–5.1)
Alkaline Phosphatase: 70 U/L (ref 40–115)
BUN: 17 mg/dL (ref 7–25)
CO2: 21 mmol/L (ref 20–32)
CREATININE: 1 mg/dL (ref 0.60–1.35)
Calcium: 9.4 mg/dL (ref 8.6–10.3)
Chloride: 104 mmol/L (ref 98–110)
Glucose, Bld: 83 mg/dL (ref 65–99)
POTASSIUM: 4.5 mmol/L (ref 3.5–5.3)
SODIUM: 140 mmol/L (ref 135–146)
Total Bilirubin: 0.6 mg/dL (ref 0.2–1.2)
Total Protein: 7.1 g/dL (ref 6.1–8.1)

## 2017-06-09 ENCOUNTER — Other Ambulatory Visit: Payer: Self-pay | Admitting: Primary Care

## 2017-06-09 DIAGNOSIS — E785 Hyperlipidemia, unspecified: Secondary | ICD-10-CM

## 2017-07-16 ENCOUNTER — Other Ambulatory Visit: Payer: Managed Care, Other (non HMO)

## 2017-10-14 IMAGING — DX DG CHEST 2V
2 series · 2 of 2 positions shown · non-contrast
Comparison: Radiographs 03/01/2014 and 05/30/2006.

CLINICAL DATA: Cough with right-sided chest pain. Ex-smoker with
history of hypertension.

EXAM:
CHEST  2 VIEW

[chest pa]
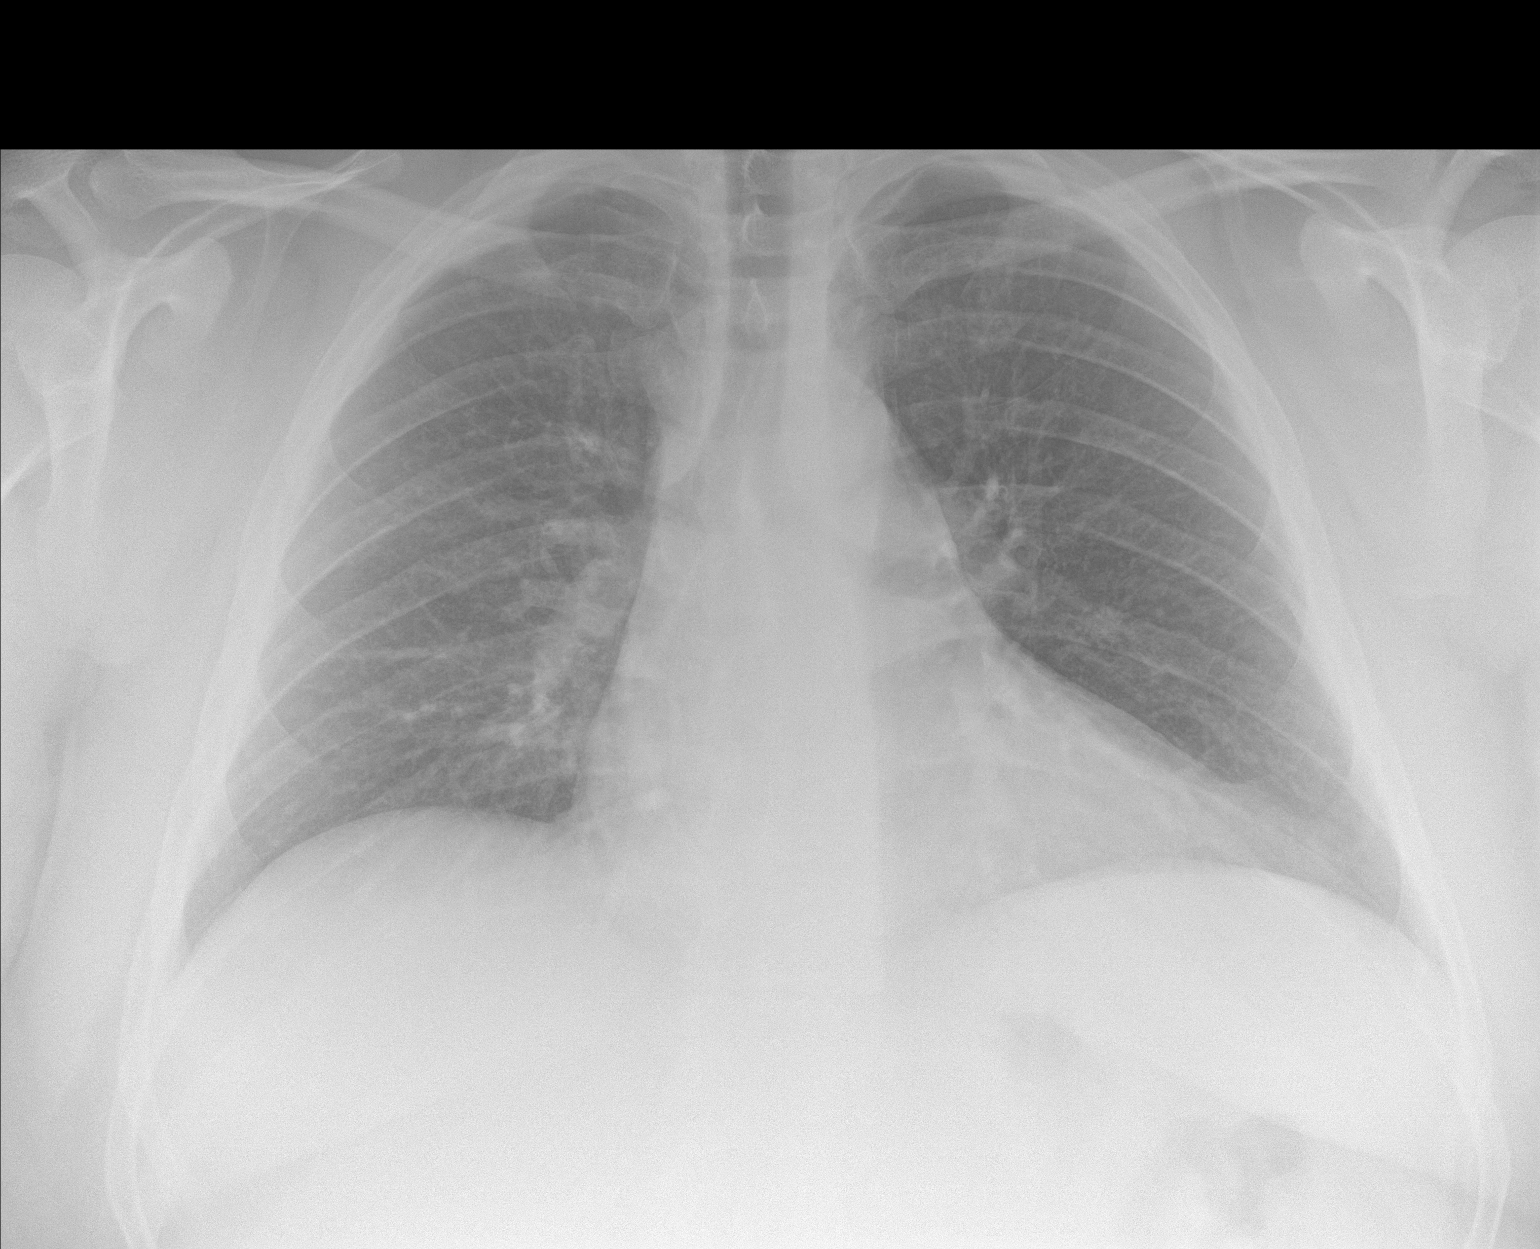

[chest lat]
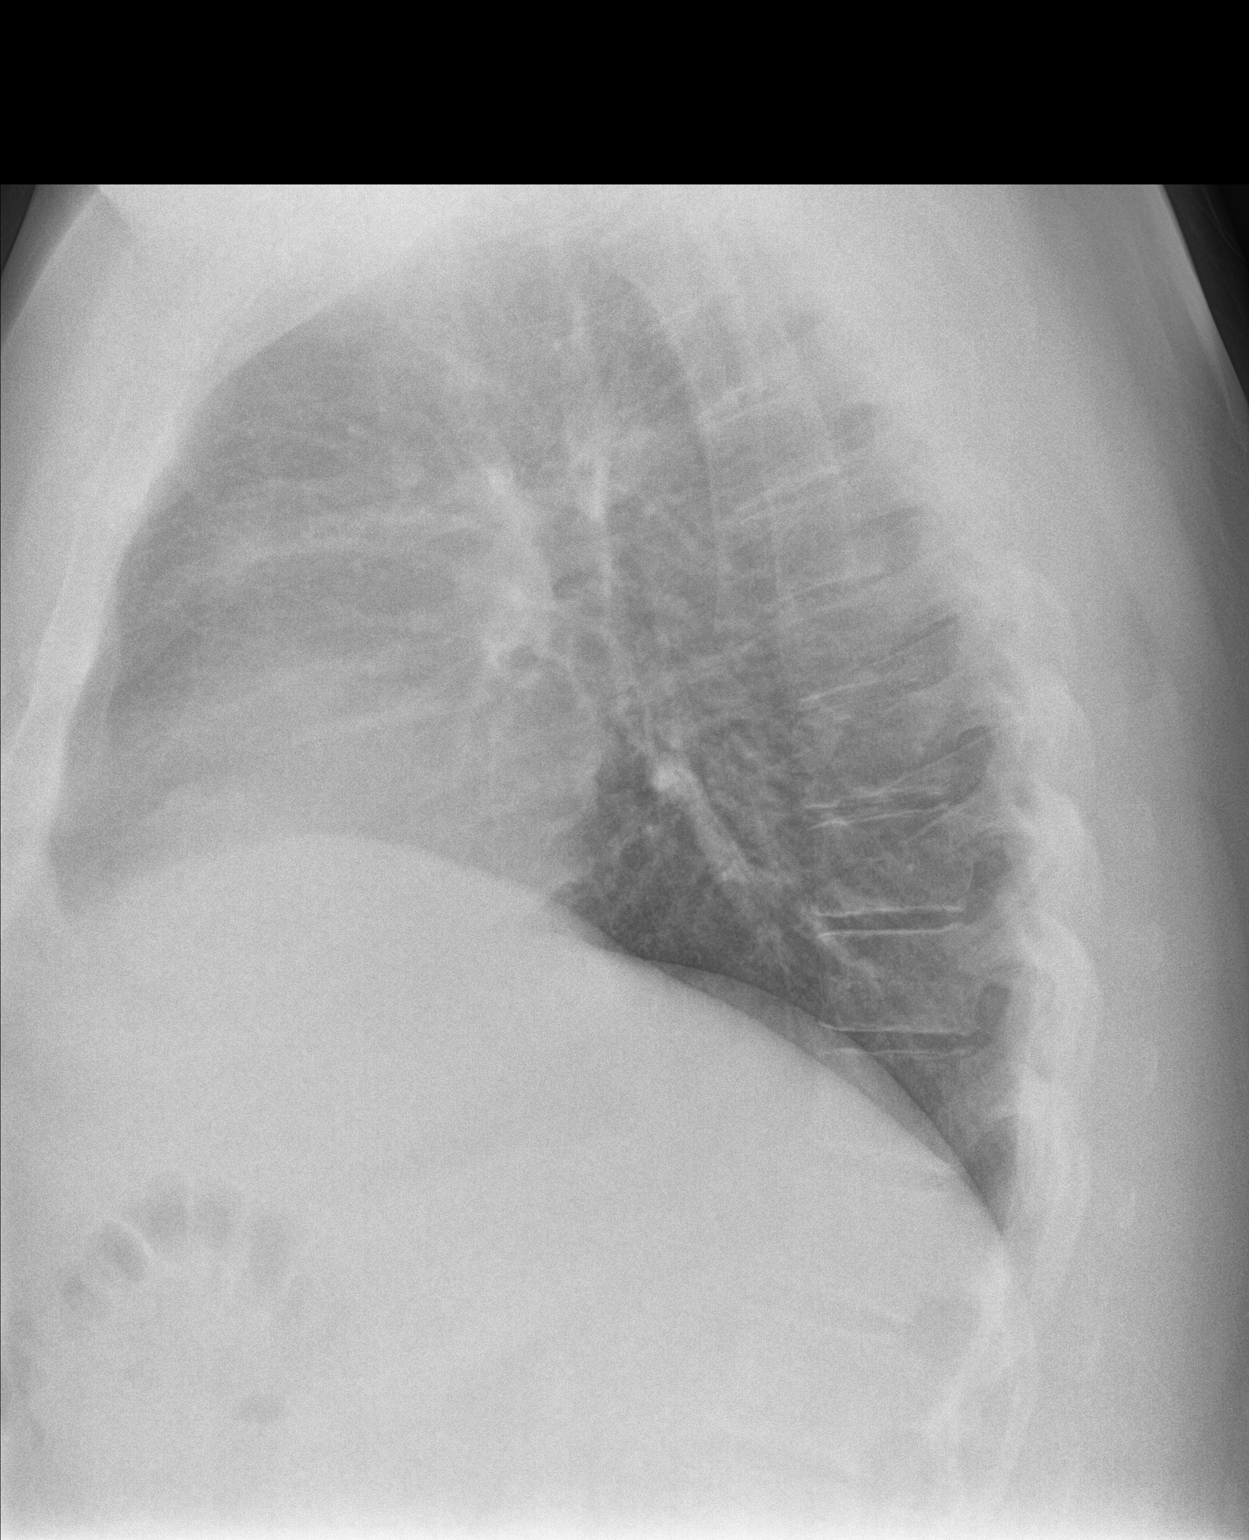

[2 of 2 positions shown; findings below may reference images not displayed]

FINDINGS: The heart size and mediastinal contours are stable. There are
chronic low lung volumes with mild vascular crowding at the lung
bases. No edema, confluent airspace opacity or pleural effusion. The
bones appear unchanged.
IMPRESSION: Stable chest.  No acute cardiopulmonary process.

## 2020-02-27 ENCOUNTER — Ambulatory Visit: Payer: Managed Care, Other (non HMO) | Admitting: Dermatology

## 2020-08-22 ENCOUNTER — Ambulatory Visit: Payer: 59 | Admitting: Dermatology

## 2020-11-27 ENCOUNTER — Ambulatory Visit: Payer: 59 | Admitting: Dermatology

## 2022-01-16 ENCOUNTER — Ambulatory Visit (INDEPENDENT_AMBULATORY_CARE_PROVIDER_SITE_OTHER): Payer: 59 | Admitting: Dermatology

## 2022-01-16 DIAGNOSIS — L821 Other seborrheic keratosis: Secondary | ICD-10-CM | POA: Diagnosis not present

## 2022-01-16 DIAGNOSIS — L820 Inflamed seborrheic keratosis: Secondary | ICD-10-CM | POA: Diagnosis not present

## 2022-01-16 DIAGNOSIS — B079 Viral wart, unspecified: Secondary | ICD-10-CM

## 2022-01-16 DIAGNOSIS — I781 Nevus, non-neoplastic: Secondary | ICD-10-CM | POA: Diagnosis not present

## 2022-01-16 DIAGNOSIS — L814 Other melanin hyperpigmentation: Secondary | ICD-10-CM

## 2022-01-16 DIAGNOSIS — L738 Other specified follicular disorders: Secondary | ICD-10-CM | POA: Diagnosis not present

## 2022-01-16 DIAGNOSIS — Q825 Congenital non-neoplastic nevus: Secondary | ICD-10-CM

## 2022-01-16 NOTE — Patient Instructions (Addendum)
Cryotherapy Aftercare ? ?Wash gently with soap and water everyday.   ?Apply Vaseline and Band-Aid daily until healed.  ? ? ?Seborrheic Keratosis ? ?What causes seborrheic keratoses? ?Seborrheic keratoses are harmless, common skin growths that first appear during adult life.  As time goes by, more growths appear.  Some people may develop a large number of them.  Seborrheic keratoses appear on both covered and uncovered body parts.  They are not caused by sunlight.  The tendency to develop seborrheic keratoses can be inherited.  They vary in color from skin-colored to gray, brown, or even black.  They can be either smooth or have a rough, warty surface.   ?Seborrheic keratoses are superficial and look as if they were stuck on the skin.  Under the microscope this type of keratosis looks like layers upon layers of skin.  That is why at times the top layer may seem to fall off, but the rest of the growth remains and re-grows.   ? ?Treatment ?Seborrheic keratoses do not need to be treated, but can easily be removed in the office.  Seborrheic keratoses often cause symptoms when they rub on clothing or jewelry.  Lesions can be in the way of shaving.  If they become inflamed, they can cause itching, soreness, or burning.  Removal of a seborrheic keratosis can be accomplished by freezing, burning, or surgery. ?If any spot bleeds, scabs, or grows rapidly, please return to have it checked, as these can be an indication of a skin cancer. ? ? ?If You Need Anything After Your Visit ? ?If you have any questions or concerns for your doctor, please call our main line at [phone] and press option 4 to reach your doctor's medical assistant. If no one answers, please leave a voicemail as directed and we will return your call as soon as possible. Messages left after 4 pm will be answered the following business day.  ? ?You may also send us a message via MyChart. We typically respond to MyChart messages within 1-2 business  days. ? ?For prescription refills, please ask your pharmacy to contact our office. Our fax number is [phone]. ? ?If you have an urgent issue when the clinic is closed that cannot wait until the next business day, you can page your doctor at the number below.   ? ?Please note that while we do our best to be available for urgent issues outside of office hours, we are not available 24/7.  ? ?If you have an urgent issue and are unable to reach us, you may choose to seek medical care at your doctor's office, retail clinic, urgent care center, or emergency room. ? ?If you have a medical emergency, please immediately call 911 or go to the emergency department. ? ?Pager Numbers ? ?- Dr. Kowalski: [phone] ? ?- Dr. Moye: [phone] ? ?- Dr. Stewart: [phone] ? ?In the event of inclement weather, please call our main line at [phone] for an update on the status of any delays or closures. ? ?Dermatology Medication Tips: ?Please keep the boxes that topical medications come in in order to help keep track of the instructions about where and how to use these. Pharmacies typically print the medication instructions only on the boxes and not directly on the medication tubes.  ? ?If your medication is too expensive, please contact our office at [phone] option 4 or send us a message through MyChart.  ? ?We are unable to tell what your co-pay for medications will be in advance   as this is different depending on your insurance coverage. However, we may be able to find a substitute medication at lower cost or fill out paperwork to get insurance to cover a needed medication.  ? ?If a prior authorization is required to get your medication covered by your insurance company, please allow us 1-2 business days to complete this process. ? ?Drug prices often vary depending on where the prescription is filled and some pharmacies may offer cheaper prices. ? ?The website www.goodrx.com contains coupons for medications through  different pharmacies. The prices here do not account for what the cost may be with help from insurance (it may be cheaper with your insurance), but the website can give you the price if you did not use any insurance.  ?- You can print the associated coupon and take it with your prescription to the pharmacy.  ?- You may also stop by our office during regular business hours and pick up a GoodRx coupon card.  ?- If you need your prescription sent electronically to a different pharmacy, notify our office through [facility] MyChart or by phone at [phone] option 4. ? ? ? ? ?Si Usted Necesita Algo Despu?s de Su Visita ? ?Tambi?n puede enviarnos un mensaje a trav?s de MyChart. Por lo general respondemos a los mensajes de MyChart en el transcurso de 1 a 2 d?as h?biles. ? ?Para renovar recetas, por favor pida a su farmacia que se ponga en contacto con nuestra oficina. Nuestro n?mero de fax es el [phone]. ? ?Si tiene un asunto urgente cuando la cl?nica est? cerrada y que no puede esperar hasta el siguiente d?a h?bil, puede llamar/localizar a su doctor(a) al n?mero que aparece a continuaci?n.  ? ?Por favor, tenga en cuenta que aunque hacemos todo lo posible para estar disponibles para asuntos urgentes fuera del horario de oficina, no estamos disponibles las 24 horas del d?a, los 7 d?as de la semana.  ? ?Si tiene un problema urgente y no puede comunicarse con nosotros, puede optar por buscar atenci?n m?dica  en el consultorio de su doctor(a), en una cl?nica privada, en un centro de atenci?n urgente o en una sala de emergencias. ? ?Si tiene una emergencia m?dica, por favor llame inmediatamente al 911 o vaya a la sala de emergencias. ? ?N?meros de b?per ? ?- Dr. Kowalski: [phone] ? ?- Dra. Moye: [phone] ? ?- Dra. Stewart: [phone] ? ?En caso de inclemencias del tiempo, por favor llame a nuestra l?nea principal al [phone] para una actualizaci?n sobre el estado de cualquier retraso o cierre. ? ?Consejos  para la medicaci?n en dermatolog?a: ?Por favor, guarde las cajas en las que vienen los medicamentos de uso t?pico para ayudarle a seguir las instrucciones sobre d?nde y c?mo usarlos. Las farmacias generalmente imprimen las instrucciones del medicamento s?lo en las cajas y no directamente en los tubos del medicamento.  ? ?Si su medicamento es muy caro, por favor, p?ngase en contacto con nuestra oficina llamando al [phone] y presione la opci?n 4 o env?enos un mensaje a trav?s de MyChart.  ? ?No podemos decirle cu?l ser? su copago por los medicamentos por adelantado ya que esto es diferente dependiendo de la cobertura de su seguro. Sin embargo, es posible que podamos encontrar un medicamento sustituto a menor costo o llenar un formulario para que el seguro cubra el medicamento que se considera necesario.  ? ?Si se requiere una autorizaci?n previa para que su compa??a de seguros cubra su medicamento, por favor perm?tanos de 1 a 2 d?as   h?biles para completar este proceso. ? ?Los precios de los medicamentos var?an con frecuencia dependiendo del lugar de d?nde se surte la receta y alguna farmacias pueden ofrecer precios m?s baratos. ? ?El sitio web www.goodrx.com tiene cupones para medicamentos de diferentes farmacias. Los precios aqu? no tienen en cuenta lo que podr?a costar con la ayuda del seguro (puede ser m?s barato con su seguro), pero el sitio web puede darle el precio si no utiliz? ning?n seguro.  ?- Puede imprimir el cup?n correspondiente y llevarlo con su receta a la farmacia.  ?- Tambi?n puede pasar por nuestra oficina durante el horario de atenci?n regular y recoger una tarjeta de cupones de GoodRx.  ?- Si necesita que su receta se env?e electr?nicamente a una farmacia diferente, informe a nuestra oficina a trav?s de MyChart de [facility] o por tel?fono llamando al [phone] y presione la opci?n 4. ? ?

## 2022-01-16 NOTE — Progress Notes (Signed)
? ?New Patient Visit ? ?Subjective  ?Joe MCMANAWAY is a 41 y.o. male who presents for the following: check bump (1 yr. L forearm- stays red and scaly, R forearm- no symptoms), Warts (L hand, several yrs), and Skin Tag (L upper eyelid, 57yr, growing, irritating). ? ?The patient has spots, moles and lesions to be evaluated, some may be new or changing and the patient has concerns that these could be cancer. ? ? ?The following portions of the chart were reviewed this encounter and updated as appropriate:  ?  ?  ? ?Review of Systems:  No other skin or systemic complaints except as noted in HPI or Assessment and Plan. ? ?Objective  ?Well appearing patient in no apparent distress; mood and affect are within normal limits. ? ?A focused examination was performed including face, L arm, L hand, R arm. Relevant physical exam findings are noted in the Assessment and Plan. ? ?face ?Telangiectasias face, esp mid face ? ?L upper eyelid margin x 1 ?2 mm waxy pedunculated pink/flesh pap at L lateral upper eyelid margin ? ?L upper arm ? ?L upper arm ?9.13mm brown pap with emerging hair, no changes per pt ? ?L forearm x 1, L hand dorsum x 3 (4) ?Flat flesh scaly paps ? ? ? 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 ?Discussed the treatment option of BBL/laser.  Typically we recommend 1-3 treatment sessions about 5-8 weeks apart for best results.  The patient's condition may require "maintenance treatments" in the future.  The fee for BBL / laser treatments is $350 per treatment session for the whole face.  A fee can be quoted for other parts of the body. ?Insurance typically does not pay for BBL/laser treatments and therefore the fee is an out-of-pocket cost. ? ? ?Seborrheic keratosis, inflamed ?L upper eyelid margin x 1 ? ?Vs irritated Skin tag ? ?If doesn't clear with cryotherapy, discussed snip removal ? ?Destruction of lesion - L upper eyelid margin x 1 ? ?Destruction method: cryotherapy   ?Informed consent: discussed and  consent obtained   ?Lesion destroyed using liquid nitrogen: Yes   ?Region frozen until ice ball extended beyond lesion: Yes   ?Outcome: patient tolerated procedure well with no complications   ?Post-procedure details: wound care instructions given   ?Additional details:  Prior to procedure, discussed risks of blister formation, small wound, skin dyspigmentation, or rare scar following cryotherapy. Recommend Vaseline ointment to treated areas while healing.  ? 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 ?Benign-appearing.  Observation.  Call clinic for new or changing lesions.  Recommend daily use of broad spectrum spf 30+ sunscreen to sun-exposed areas.   ? ?Viral warts, unspecified type (4) ?L forearm x 1, L hand dorsum x 3 ? ?Flat warts vrs ISKs ? ?Destruction of lesion - L forearm x 1, L hand dorsum x 3 ? ?Destruction method: cryotherapy   ?Informed consent: discussed and consent obtained   ?Lesion destroyed using liquid nitrogen: Yes   ?Region frozen until ice ball extended beyond lesion: Yes   ?Outcome: patient tolerated procedure well with no complications   ?Post-procedure details: wound care instructions given   ?Additional details:  Prior to procedure, discussed risks of blister formation, small wound, skin dyspigmentation, or rare scar following cryotherapy. Recommend Vaseline ointment to treated areas while healing.  ? ? ?Seborrheic Keratoses ?- Stuck-on, waxy, tan-brown papules including R forearm ?- Benign-appearing ?- Discussed benign etiology and prognosis. ?- Observe ?- Call for any changes ? ?Sebaceous Hyperplasia ?- Small  yellow papules with a central dell ?- Benign ?- Observe  ?- L paranasal ?- Discussed ED, $60 for first lesion and $15 for each additional treated on same day. ? ?Lentigines ?- Scattered tan macules ?- Due to sun exposure ?- Benign-appering, observe ?- Recommend daily broad spectrum sunscreen SPF 30+ to sun-exposed areas, reapply every 2 hours as needed. ?- Call for any changes   ? ?Return for pt will call in August to schedule BBL on a Wednesday at 1:30 with Dr. Roseanne Reno. ? ? ?I, Sonya Hupman, RMA, am acting as scribe for Willeen Niece, MD . ? ?Documentation: I have reviewed the above documentation for accuracy and completeness, and I agree with the above. ? ?Willeen Niece MD  ? ?

## 2024-01-29 ENCOUNTER — Ambulatory Visit: Payer: Self-pay | Admitting: Podiatry

## 2024-04-04 DIAGNOSIS — Z0289 Encounter for other administrative examinations: Secondary | ICD-10-CM

## 2024-04-14 ENCOUNTER — Encounter: Payer: Self-pay | Admitting: Podiatry

## 2024-04-14 ENCOUNTER — Ambulatory Visit (INDEPENDENT_AMBULATORY_CARE_PROVIDER_SITE_OTHER): Admitting: Podiatry

## 2024-04-14 DIAGNOSIS — L923 Foreign body granuloma of the skin and subcutaneous tissue: Secondary | ICD-10-CM

## 2024-04-14 DIAGNOSIS — M2042 Other hammer toe(s) (acquired), left foot: Secondary | ICD-10-CM

## 2024-04-14 NOTE — Progress Notes (Signed)
  Subjective:  Patient ID: Joe Snyder, male    DOB: [birth year],  MRN: [id number] HPI Chief Complaint  Patient presents with   Foot Pain    3rd toe left - small, dark spot x 3 weeks, only noticed when barefoot initially, but then also felt tender with sneakers, doesn't remember an injury   New Patient (Initial Visit)    43 y.o. male presents with the above complaint.   ROS: Denies fever chills nausea muscle aches pains calf pain back pain chest pain shortness of breath.  Past Medical History:  Diagnosis Date   Essential hypertension    Hyperlipidemia    Morbid obesity with BMI of 40.0-44.9, adult [facility])    Palpitations    Testicular hypofunction    Past Surgical History:  Procedure Laterality Date   PLANTAR'S WART EXCISION     URETHROPLASTY  2010    Current Outpatient Medications:    losartan-hydrochlorothiazide (HYZAAR) 100-25 MG tablet, Take 1 tablet by mouth daily., Disp: , Rfl:    rosuvastatin (CRESTOR) 20 MG tablet, , Disp: , Rfl:   No Known Allergies Review of Systems Objective:  There were no vitals filed for this visit.  General: Well developed, nourished, in no acute distress, alert and oriented x3   Dermatological: Skin is warm, dry and supple bilateral. Nails x 10 are well maintained; remaining integument appears unremarkable at this time. There are no open sores, no preulcerative lesions, no rash or signs of infection present.  Small foreign bodies which appear to be plant life origin such as thorns or cacti needles with mild erythema surrounding face small less than 1 mm in diameter dark areas.  These are located on the base of the third toe left foot.  Vascular: Dorsalis Pedis artery and Posterior Tibial artery pedal pulses are 2/4 bilateral with immedate capillary fill time. Pedal hair growth present. No varicosities and no lower extremity edema present bilateral.   Neruologic: Grossly intact via light touch bilateral. Vibratory intact via tuning fork  bilateral. Protective threshold with Semmes Wienstein monofilament intact to all pedal sites bilateral. Patellar and Achilles deep tendon reflexes 2+ bilateral. No Babinski or clonus noted bilateral.   Musculoskeletal: No gross boney pedal deformities bilateral. No pain, crepitus, or limitation noted with foot and ankle range of motion bilateral. Muscular strength 5/5 in all groups tested bilateral.  Gait: Unassisted, Nonantalgic.    Radiographs:  None taken  Assessment & Plan:   Assessment: Foreign body superficial skin third toe left  Plan: Utilizing scalpel I was able to remove these small foreign bodies on palpation there was no further pain.  Alcohol was placed and Band-Aid.     Kiosha Buchan T. [place], [place]

## 2024-05-30 ENCOUNTER — Ambulatory Visit (INDEPENDENT_AMBULATORY_CARE_PROVIDER_SITE_OTHER): Admitting: Nurse Practitioner

## 2024-05-30 ENCOUNTER — Encounter (INDEPENDENT_AMBULATORY_CARE_PROVIDER_SITE_OTHER): Payer: Self-pay | Admitting: Nurse Practitioner

## 2024-05-30 VITALS — BP 127/75 | HR 78 | Temp 98.3°F | Ht 71.0 in | Wt 357.0 lb

## 2024-05-30 DIAGNOSIS — R0602 Shortness of breath: Secondary | ICD-10-CM | POA: Insufficient documentation

## 2024-05-30 DIAGNOSIS — I10 Essential (primary) hypertension: Secondary | ICD-10-CM

## 2024-05-30 DIAGNOSIS — E66813 Obesity, class 3: Secondary | ICD-10-CM

## 2024-05-30 DIAGNOSIS — E782 Mixed hyperlipidemia: Secondary | ICD-10-CM

## 2024-05-30 DIAGNOSIS — E781 Pure hyperglyceridemia: Secondary | ICD-10-CM

## 2024-05-30 DIAGNOSIS — R5383 Other fatigue: Secondary | ICD-10-CM

## 2024-05-30 DIAGNOSIS — T733XXA Exhaustion due to excessive exertion, initial encounter: Secondary | ICD-10-CM | POA: Insufficient documentation

## 2024-05-30 DIAGNOSIS — Z1331 Encounter for screening for depression: Secondary | ICD-10-CM | POA: Diagnosis not present

## 2024-05-30 DIAGNOSIS — E559 Vitamin D deficiency, unspecified: Secondary | ICD-10-CM | POA: Diagnosis not present

## 2024-05-30 DIAGNOSIS — Z6841 Body Mass Index (BMI) 40.0 and over, adult: Secondary | ICD-10-CM

## 2024-05-30 DIAGNOSIS — E349 Endocrine disorder, unspecified: Secondary | ICD-10-CM

## 2024-05-30 DIAGNOSIS — G4733 Obstructive sleep apnea (adult) (pediatric): Secondary | ICD-10-CM

## 2024-05-30 NOTE — Progress Notes (Signed)
 [address] [place],  [place], [place] [address]  Office: [phone]  /  Fax: [phone]   Subjective   Initial Visit  Joe Snyder (MR# [id number]) is a 43 y.o. male who presents for evaluation and treatment of obesity and related comorbidities. Current BMI is Body mass index is 49.79 kg/m. Joe Snyder has been struggling with his weight for many years and has been unsuccessful in either losing weight, maintaining weight loss, or reaching his healthy weight goal.  Joe Snyder is currently in the action stage of change and ready to dedicate time achieving and maintaining a healthier weight. Joe Snyder is interested in becoming our patient and working on intensive lifestyle modifications including (but not limited to) diet and exercise for weight loss.  Joe Snyder did use Phentermine over 1 year ago and went from 385 to 335.  Since stopping his weight went to 370 and today weighs 357. No side effects with medication. Joe Snyder is currently on a 16/8 fast plan( at least 4/7 days). Joe Snyder is walking and doing hand weights 30-60 minutes 4-7 times a week. Joe Snyder does average 9000-12000 steps a day.  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   Joe Snyder does have hypertension and is currently well controlled on Losartan/hydrochlorothiazide 100/25 mg every day BP Readings from Last 3 Encounters:  05/30/24 127/75  04/13/17 130/80  05/07/16 134/78   Joe Snyder has hyperlipidemia and hypertriglyceridemia. Joe Snyder does get his labs through [facility] so they are not visible on Epic. Joe Snyder is currently on Rosuvastatin 20 mg every day.  Joe Snyder does have sleep apnea and is currently on CPAP which Joe Snyder endorses 100% use. Joe Snyder is going to start Joe Snyder 200 mg IM q 2 weeks for low testosterone discovered through labwork.  Previously told Joe Snyder had fatty liver through [facility], never had imaging.  Last labs showed normal liver functions  Weight history:  When asked how their weight has affected  their life and health, Joe Snyder states: Contributed to medical problems, Having fatigue, and Having poor endurance  When asked what else they would like to accomplish? Joe Snyder states: Adopt a healthier eating pattern and lifestyle, Improve energy levels and physical activity, Improve existing medical conditions, Reduce number of medications, and Improve quality of life  Joe Snyder starting to note weight gain during : adulthood.  Life events associated with weight gain include : Had triplets 10 years ago and were in hospital x 6 weeks. Started eating more convenience and fast food. .   Other contributing factors: family history of obesity, disruption of circadian rhythm / sleep disordered breathing, consumption of processed foods, moderate to high levels of stress, chronic skipping of meals, sedentary job, and need for convenient foods.  Their highest weight has been:  385 lbs.  Desired weight: 240  Previous weight-loss programs : Intermittent fasting.  Their maximum weight loss was:  30 lbs. Has kept it off for over a year  Their greatest challenge with dieting: night time eating- has 3 small kids 73 year old triplets. .  Current or previous pharmacotherapy: Phentermine.  Response to medication: Lost weight initially but was unable to sustain weight loss   Nutritional History:  Current nutrition plan: None.  How many times do you eat outside the home: 2-4 per week  How often do they skip meals: skips breakfast  What beverages do they drink: water, caffeinated beverages , and alcohol( may drink a couple of beers or vodka on a weekend and then not  drink for several weeks- social drinker).   Use of artificial sweetners : No  Food intolerances or dislikes: Brussel sprouts.  Food triggers: Stress, Boredom, and To help comfort self.  Food cravings: Starches / Carbohydrates, Large portions, and Eating out  Do they struggle with excessive hunger or portion control : Yes    Physical  Activity:  Current level of physical activity: Walking 30 minutes, seven a week and Strength training 30 minutes, five  Barriers to Exercise: time and energy   Past medical history includes:   Past Medical History:  Diagnosis Date   Essential hypertension    Hyperlipidemia    Morbid obesity with BMI of 40.0-44.9, adult (HCC)    Obesity    Palpitations    Pre-diabetes    Sleep apnea    Testicular hypofunction      Objective   BP 127/75   Pulse 78   Temp 98.3 F (36.8 C)   Ht 5' 11 (1.803 m)   Wt (!) 357 lb (161.9 kg)   SpO2 98%   BMI 49.79 kg/m  Joe Snyder was weighed on the bioimpedance scale: Body mass index is 49.79 kg/m.    Anthropometrics:  Vitals Temp: 98.3 F (36.8 C) BP: 127/75 Pulse Rate: 78 SpO2: 98 %   Anthropometric Measurements Height: 5' 11 (1.803 m) Weight: (!) 357 lb (161.9 kg) BMI (Calculated): 49.81 Peak Weight: 385 lb Waist Measurement : 59 inches   Body Composition  Body Fat %: 38.8 % Fat Mass (lbs): 138.6 lbs Muscle Mass (lbs): 207.8 lbs Total Body Water (lbs): 151.6 lbs Visceral Fat Rating : 27   Other Clinical Data RMR: [id number] Fasting: yes Labs: yes Today's Visit #: 1 Starting Date: 05/30/24    Physical Exam:  General: Joe Snyder is overweight, cooperative, alert, well developed, and in no acute distress. PSYCH: Has normal mood, affect and thought process.   HEENT: EOMI, sclerae are anicteric. Lungs: Normal breathing effort, no conversational dyspnea. Extremities: No edema.  Neurologic: No gross sensory or motor deficits. No tremors or fasciculations noted.    Diagnostic Data Reviewed  EKG: Normal sinus rhythm, rate 69. No conduction abnormalities, abnormal Q waves or chamber enlargement.  Indirect Calorimeter completed today shows a VO2 of 396 and a REE of 2736.  His calculated basal metabolic rate is 6855 thus his resting energy expenditure slower than calculated.  Depression Screen  Rolin's PHQ-9 score was: 4.      05/30/2024   10:47 AM  Depression screen PHQ 2/9  Decreased Interest 0  Down, Depressed, Hopeless 0  PHQ - 2 Score 0  Altered sleeping 0  Tired, decreased energy 2  Change in appetite 0  Feeling bad or failure about yourself  0  Trouble concentrating 1  Moving slowly or fidgety/restless 1  Suicidal thoughts 0  PHQ-9 Score 4  Difficult doing work/chores Not difficult at all    Screening for Sleep Related Breathing Disorders  Joe Snyder does have sleep apnea and endorses 100% CPAP use. Sleeps 6-7 hours and awakens refreshed with use of CPAP. Current Epworth score is 5.   BMET    Component Value Date/Time   NA 140 04/13/2017 1200   NA 138 03/01/2014 2353   K 4.5 04/13/2017 1200   K 4.1 03/01/2014 2353   CL 104 04/13/2017 1200   CL 104 03/01/2014 2353   CO2 21 04/13/2017 1200   CO2 26 03/01/2014 2353   GLUCOSE 83 04/13/2017 1200   GLUCOSE 100 (H) 03/01/2014 2353   BUN  17 04/13/2017 1200   BUN 18 03/01/2014 2353   CREATININE 1.00 04/13/2017 1200   CALCIUM  9.4 04/13/2017 1200   CALCIUM  9.3 03/01/2014 2353   GFRNONAA >60 03/01/2014 2353   GFRAA >60 03/01/2014 2353   Lab Results  Component Value Date   HGBA1C 5.3 04/13/2017   HGBA1C 5.4 09/04/2015   No results found for: INSULIN  CBC    Component Value Date/Time   WBC 13.7 (H) 03/01/2014 2353   RBC 5.29 03/01/2014 2353   HGB 14.8 03/01/2014 2353   HCT 46.2 03/01/2014 2353   PLT 216 03/01/2014 2353   MCV 87 03/01/2014 2353   MCH 28.0 03/01/2014 2353   MCHC 32.1 03/01/2014 2353   RDW 13.4 03/01/2014 2353   Iron/TIBC/Ferritin/ %Sat No results found for: IRON, TIBC, FERRITIN, IRONPCTSAT Lipid Panel     Component Value Date/Time   CHOL 162 04/13/2017 1200   TRIG 432 (H) 04/13/2017 1200   HDL 35 (L) 04/13/2017 1200   CHOLHDL 4.6 04/13/2017 1200   VLDL NOT CALC 04/13/2017 1200   LDLCALC NOT CALC 04/13/2017 1200   LDLDIRECT 94.0 03/28/2016 0825   Hepatic Function Panel     Component Value Date/Time   PROT  7.1 04/13/2017 1200   ALBUMIN 4.3 04/13/2017 1200   AST 25 04/13/2017 1200   ALT 39 04/13/2017 1200   ALKPHOS 70 04/13/2017 1200   BILITOT 0.6 04/13/2017 1200   No results found for: TSH   Assessment and Plan   TREATMENT PLAN FOR OBESITY: Class 3 severe obesity with serious comorbidity and body mass index (BMI) of 45.0 to 49.9 in adult, unspecified obesity type Recommended Dietary Goals  Kadarious is currently in the action stage of change. As such, his goal is to implement medically supervised obesity management plan.  Joe Snyder has agreed to implement: 2200 calorie 140 gram protein low carb meal plan  Behavioral Intervention  We discussed the following Behavioral Modification Strategies today: increasing lean protein intake to established goals, decreasing simple carbohydrates , increasing vegetables, increasing lower glycemic fruits, increasing fiber rich foods, avoiding skipping meals, increasing water intake, work on meal planning and preparation, reading food labels , keeping healthy foods at home, identifying sources and decreasing liquid calories, decreasing eating out or consumption of processed foods, and making healthy choices when eating convenient foods, planning for success, and better snacking choices  Additional resources provided today: Handout on healthy eating and balanced plate, Handout on complex carbohydrates and lean sources of protein, Personalized instruction on the use of artificial intelligence for recipes, tailored meal plans, calorie tracking, and finding healthier options when eating out. , and Handout principles of weight management. Personalized plan for 2200 calories, 140 grams of protein and lower carb meal plan was generated for Joe Snyder  Recommended Physical Activity Goals  Joe Snyder has been advised to work up to 150 minutes of moderate intensity aerobic activity a week and strengthening exercises 2-3 times per week for cardiovascular health, weight loss  maintenance and preservation of muscle mass.   Joe Snyder has agreed to :  Continue current level of physical activity , Think about enjoyable ways to increase daily physical activity and overcoming barriers to exercise, and Increase physical activity in their day and reduce sedentary time (increase NEAT).  Medical Interventions and Pharmacotherapy We will work on building a Therapist, art and behavioral strategies. We will discuss the role of pharmacotherapy as an adjunct at subsequent visits.   ASSOCIATED CONDITIONS ADDRESSED TODAY  Other Fatigue Joe Snyder does feel that his  weight is causing his energy to be lower than it should be. Fatigue may be related to obesity, depression or many other causes. Labs will be ordered, and in the meanwhile, Joe Snyder will focus on self care including making healthy food choices, increasing physical activity and focusing on stress reduction. -     EKG 12-Lead  Shortness of Breath Joe Snyder notes increasing shortness of breath with physical activity and seems to be worsening over time with weight gain. Joe Snyder notes getting out of breath sooner with activity than Joe Snyder used to. This has not gotten worse recently. Joe Snyder denies shortness of breath at rest or orthopnea.  Depression screening No current depression on screening tool.   Essential hypertension Continue Losartan/hydrochlorothiazide 100/25 mg every day Start 2200 calorie 140 gram protein and lower carb meal plan and DASH diet Monitor BP and if consistently >140/90 notify PCP If develops headaches, chest pain, shortness of breath or dizziness go to ER Loss of 10-15% body weight can help improve blood pressures  -     Comprehensive metabolic panel with GFR -     CBC with Differential/Platelet  Mixed hyperlipidemia Focus on implementing 2200 calorie 140 gram protein and lower carb category  meal plan, limit saturated fats Loss of 10-15% body weight can improve lipid levels Focus on getting 150  minutes a week of moderate to high intensity exercise  -     Comprehensive metabolic panel with GFR  Hypertriglyceridemia Focus on implementing 2200 calorie 140 gram protein and lower carb category meal plan, limit saturated fats and simple carbs Loss of 10-15% body weight can improve lipid levels Focus on getting 150 minutes a week of moderate to high intensity exercise  -     Comprehensive metabolic panel with GFR  Vitamin D  deficiency If Vit D level remains low will supplement with Ergocalciferol  50000 units once a week for 12 weeks and then recheck level.   -     VITAMIN D  25 Hydroxy (Vit-D Deficiency, Fractures)  OSA on CPAP Continue 100% CPAP use Loss of 10-15% body weight can help reduce need for CPAP  Class 3 severe obesity with serious comorbidity and body mass index (BMI) of 45.0 to 49.9 in adult, unspecified obesity type Refer to plan above -     Comprehensive metabolic panel with GFR -     Hemoglobin A1c -     CBC with Differential/Platelet -     Insulin , random -     Magnesium -     TSH -     Vitamin B12 -     VITAMIN D  25 Hydroxy (Vit-D Deficiency, Fractures)  Testosterone deficiency Start 2200 calorie 140 gram protein and lower carb category meal plan Continue testosterone supplementation and monitoring through PCP Loss of 10-15% body weight can help improve testosterone level    Follow-up  Joe Snyder was informed of the importance of frequent follow-up visits to maximize his success with intensive lifestyle modifications for his multiple health conditions. Joe Snyder was informed we would discuss his lab results at his next visit unless there is a critical issue that needs to be addressed sooner. Keymari agreed to keep his next visit at the agreed upon time to discuss these results.  Attestation Statement  This is the patient's intake visit at [facility]. The patient's Health Questionnaire was reviewed at length. Included in the packet: current and past health  history, medications, allergies, ROS, gynecologic history (women only), surgical history, family history, social history, weight history, weight loss  surgery history (for those that have had weight loss surgery), nutritional evaluation, mood and food questionnaire, PHQ9, Epworth questionnaire, sleep habits questionnaire, patient life and health improvement goals questionnaire. These will all be scanned into the patient's chart under media.   During the visit, I independently reviewed the patient's EKG, previous labs, bioimpedance scale results, and indirect calorimetry results. I used this information to medically tailor a meal plan for the patient that will help him to lose weight and will improve his obesity-related conditions. I performed a medically necessary appropriate examination and/or evaluation. I discussed the assessment and treatment plan with the patient. The patient was provided an opportunity to ask questions and all were answered. The patient agreed with the plan and demonstrated an understanding of the instructions. Labs were ordered at this visit and will be reviewed at the next visit unless critical results need to be addressed immediately. Clinical information was updated and documented in the EMR.   In addition, they received basic education on identification of processed foods and reduction of these, different sources of lean proteins and complex carbohydrates and how to eat balanced by incorporation of whole foods.  Reviewed by clinician on day of visit: allergies, medications, problem list, medical history, surgical history, family history, social history, and previous encounter notes.  I have spent 51 minutes in the care of the patient today including: 13 minutes before the visit reviewing and preparing the chart. 38 minutes face-to-face assessing and reviewing listed medical problems as outlined in obesity care plan, providing nutritional and behavioral counseling on topics  outlined in the obesity care plan, independently interpreting test results and goals of care, as described in assessment and plan, reviewing and discussing biometric information and progress, reviewing latest PCP notes and specialist consultations, and ordering diagnostics - see orders 10 minutes after the visit updating chart and documentation of encounter.       Amogh Komatsu ANP-C

## 2024-05-31 ENCOUNTER — Ambulatory Visit (INDEPENDENT_AMBULATORY_CARE_PROVIDER_SITE_OTHER): Payer: Self-pay | Admitting: Nurse Practitioner

## 2024-06-01 LAB — CBC WITH DIFFERENTIAL/PLATELET
Basophils Absolute: 0 x10E3/uL (ref 0.0–0.2)
Basos: 1 %
EOS (ABSOLUTE): 0 x10E3/uL (ref 0.0–0.4)
Eos: 1 %
Hematocrit: 44.1 % (ref 37.5–51.0)
Hemoglobin: 14.4 g/dL (ref 13.0–17.7)
Immature Grans (Abs): 0.1 x10E3/uL (ref 0.0–0.1)
Immature Granulocytes: 1 %
Lymphocytes Absolute: 1.8 x10E3/uL (ref 0.7–3.1)
Lymphs: 21 %
MCH: 28 pg (ref 26.6–33.0)
MCHC: 32.7 g/dL (ref 31.5–35.7)
MCV: 86 fL (ref 79–97)
Monocytes Absolute: 0.5 x10E3/uL (ref 0.1–0.9)
Monocytes: 6 %
Neutrophils Absolute: 6.1 x10E3/uL (ref 1.4–7.0)
Neutrophils: 70 %
Platelets: 190 x10E3/uL (ref 150–450)
RBC: 5.14 x10E6/uL (ref 4.14–5.80)
RDW: 13.5 % (ref 11.6–15.4)
WBC: 8.6 x10E3/uL (ref 3.4–10.8)

## 2024-06-01 LAB — HEMOGLOBIN A1C
Est. average glucose Bld gHb Est-mCnc: 117 mg/dL
Hgb A1c MFr Bld: 5.7 % — ABNORMAL HIGH (ref 4.8–5.6)

## 2024-06-01 LAB — COMPREHENSIVE METABOLIC PANEL WITH GFR
ALT: 28 IU/L (ref 0–44)
AST: 24 IU/L (ref 0–40)
Albumin: 4.5 g/dL (ref 4.1–5.1)
Alkaline Phosphatase: 66 IU/L (ref 47–123)
BUN/Creatinine Ratio: 15 (ref 9–20)
BUN: 15 mg/dL (ref 6–24)
Bilirubin Total: 0.5 mg/dL (ref 0.0–1.2)
CO2: 22 mmol/L (ref 20–29)
Calcium: 9.5 mg/dL (ref 8.7–10.2)
Chloride: 101 mmol/L (ref 96–106)
Creatinine, Ser: 1.01 mg/dL (ref 0.76–1.27)
Globulin, Total: 2.8 g/dL (ref 1.5–4.5)
Glucose: 83 mg/dL (ref 70–99)
Potassium: 4.1 mmol/L (ref 3.5–5.2)
Sodium: 140 mmol/L (ref 134–144)
Total Protein: 7.3 g/dL (ref 6.0–8.5)
eGFR: 95 mL/min/1.73 (ref >59)

## 2024-06-01 LAB — VITAMIN D 25 HYDROXY (VIT D DEFICIENCY, FRACTURES): Vit D, 25-Hydroxy: 22.7 ng/mL — ABNORMAL LOW (ref 30.0–100.0)

## 2024-06-01 LAB — TSH: TSH: 0.884 u[IU]/mL (ref 0.450–4.500)

## 2024-06-01 LAB — MAGNESIUM: Magnesium: 2.2 mg/dL (ref 1.6–2.3)

## 2024-06-01 LAB — VITAMIN B12: Vitamin B-12: 427 pg/mL (ref 232–1245)

## 2024-06-01 LAB — INSULIN, RANDOM: INSULIN: 33.7 u[IU]/mL — AB (ref 2.6–24.9)

## 2024-06-08 ENCOUNTER — Ambulatory Visit (INDEPENDENT_AMBULATORY_CARE_PROVIDER_SITE_OTHER): Admitting: Family Medicine

## 2024-06-13 ENCOUNTER — Encounter (INDEPENDENT_AMBULATORY_CARE_PROVIDER_SITE_OTHER): Payer: Self-pay | Admitting: Nurse Practitioner

## 2024-06-13 ENCOUNTER — Ambulatory Visit (INDEPENDENT_AMBULATORY_CARE_PROVIDER_SITE_OTHER): Admitting: Nurse Practitioner

## 2024-06-13 VITALS — BP 135/75 | HR 65 | Temp 98.5°F | Ht 71.0 in | Wt 357.0 lb

## 2024-06-13 DIAGNOSIS — G4733 Obstructive sleep apnea (adult) (pediatric): Secondary | ICD-10-CM | POA: Diagnosis not present

## 2024-06-13 DIAGNOSIS — E66813 Obesity, class 3: Secondary | ICD-10-CM

## 2024-06-13 DIAGNOSIS — E782 Mixed hyperlipidemia: Secondary | ICD-10-CM

## 2024-06-13 DIAGNOSIS — I10 Essential (primary) hypertension: Secondary | ICD-10-CM

## 2024-06-13 DIAGNOSIS — E781 Pure hyperglyceridemia: Secondary | ICD-10-CM

## 2024-06-13 DIAGNOSIS — Z6841 Body Mass Index (BMI) 40.0 and over, adult: Secondary | ICD-10-CM

## 2024-06-13 DIAGNOSIS — R7303 Prediabetes: Secondary | ICD-10-CM

## 2024-06-13 DIAGNOSIS — E88819 Insulin resistance, unspecified: Secondary | ICD-10-CM

## 2024-06-13 DIAGNOSIS — E559 Vitamin D deficiency, unspecified: Secondary | ICD-10-CM | POA: Diagnosis not present

## 2024-06-13 MED ORDER — VITAMIN D (ERGOCALCIFEROL) 1.25 MG (50000 UNIT) PO CAPS: 50000.0000 [IU] | ORAL_CAPSULE | ORAL | 1 refills | Status: DC

## 2024-06-13 MED ORDER — METFORMIN HCL ER 500 MG PO TB24: 500.0000 mg | ORAL_TABLET | Freq: Every day | ORAL | 0 refills | Status: DC

## 2024-06-13 NOTE — Progress Notes (Signed)
 Office: [phone]  /  Fax: [phone]  WEIGHT SUMMARY AND BIOMETRICS  Weight Lost Since Last Visit: 0  Weight Gained Since Last Visit: 0   Vitals Temp: 98.5 F (36.9 C) BP: 135/75 Pulse Rate: 65 SpO2: 98 %   Anthropometric Measurements Height: 5' 11 (1.803 m) Weight: (!) 357 lb (161.9 kg) BMI (Calculated): 49.81 Weight at Last Visit: 357 lb Weight Lost Since Last Visit: 0 Weight Gained Since Last Visit: 0 Starting Weight: 357 lb Total Weight Loss (lbs): 0 lb (0 kg) Peak Weight: 385 lb Waist Measurement : 59 inches   Body Composition  Body Fat %: 38.91 % Fat Mass (lbs): 139 lbs Muscle Mass (lbs): 207.88 lbs Total Body Water (lbs): 152.8 lbs Visceral Fat Rating : 27   Other Clinical Data Fasting: no Labs: no Today's Visit #: 2 Starting Date: 05/30/24    Total Weight Loss: 0 Percent of body weight lost: 0   Bio Impedance Data reviewed with patient: Muscle stayed the same and adipose in up 0.4 pounds.   HPI  Chief Complaint: OBESITY  Craige is here to discuss his progress with his obesity treatment plan. He is on the 2200 calorie 140 gram protein meal plan and states he is following his eating plan approximately 30 % of the time. He states he is getting 89999 steps 7 days a week    Interval History:  Since last office visit he will stick to the plan for a couple days but will crave things and gives in and will have pizza or other foods not on plan. Had used Phentermine in the past which did help control his appetite. Yesterday went to the football game- had a cinnabon for breakfast, cheeseburger and french fries with a beer. Regular sub at [employer].  He has replaced his coffee creamer with fair life protein shake.   He has been drinking mostly water. He has been getting 100-140 grams protein daily.    He does have hypertension which is currently controlled with losartan/hydrochlorothiazide 100/25 mg 1 tab PO every day. Denies headaches,  chest pain, shortness of breath and dizziness.  BP Readings from Last 3 Encounters:  06/13/24 135/75  05/30/24 127/75  04/13/17 130/80    He does have sleep apnea and is currently on CPAP which he endorses 100% use Jourdain has hyperlipidemia and hypertriglyceridemia, he is currently on Rosuvastatin 20 mg every day(labs were recently done at [facility]) He started Testerone 200 mg IM q 2 weeks for low testosterone discovered through [employer].  Previously told he had fatty liver through [facility], never had imaging. Most recent liver enzymes are normal from last visit    PHYSICAL EXAM:  Blood pressure 135/75, pulse 65, temperature 98.5 F (36.9 C), height 5' 11 (1.803 m), weight (!) 357 lb (161.9 kg), SpO2 98%. Body mass index is 49.79 kg/m.  General: Well Developed, well nourished, and in no acute distress.  HEENT: Normocephalic, atraumatic; EOMI, sclerae are anicteric. Skin: Warm and dry, good turgor Chest:  Normal excursion, shape, no gross ABN Respiratory: No conversational dyspnea; speaking in full sentences NeuroM-Sk:  Normal gross ROM * 4 extremities  Psych: A and O X 3, insight adequate, mood- full    DIAGNOSTIC DATA REVIEWED: He does get his labs through [facility] so they are not visible on Epic.   Labs are reviewed with patient in detail.  Normal electrolytes, liver and kidney functions, thyroid, blood count and Vit B12. A1c is in prediabetic range at 5.7 and fasting  insulin  is elevated at 33.7.  HOMA-IR score is elevated at 6.96 which is consistent with insulin  resistance Vit D is low and supplementation is required  Last metabolic panel Lab Results  Component Value Date   GLUCOSE 83 05/30/2024   NA 140 05/30/2024   K 4.1 05/30/2024   CL 101 05/30/2024   CO2 22 05/30/2024   BUN 15 05/30/2024   CREATININE 1.01 05/30/2024   EGFR 95 05/30/2024   CALCIUM  9.5 05/30/2024   PROT 7.3 05/30/2024   ALBUMIN 4.5 05/30/2024   LABGLOB 2.8 05/30/2024   BILITOT 0.5  05/30/2024   ALKPHOS 66 05/30/2024   AST 24 05/30/2024   ALT 28 05/30/2024   ANIONGAP 8 03/01/2014     Lab Results  Component Value Date   HGBA1C 5.7 (H) 05/30/2024   HGBA1C 5.4 09/04/2015   Lab Results  Component Value Date   INSULIN  33.7 (H) 05/30/2024   Lab Results  Component Value Date   TSH 0.884 05/30/2024   CBC    Component Value Date/Time   WBC 8.6 05/30/2024 1235   WBC 13.7 (H) 03/01/2014 2353   RBC 5.14 05/30/2024 1235   RBC 5.29 03/01/2014 2353   HGB 14.4 05/30/2024 1235   HCT 44.1 05/30/2024 1235   PLT 190 05/30/2024 1235   MCV 86 05/30/2024 1235   MCV 87 03/01/2014 2353   MCH 28.0 05/30/2024 1235   MCH 28.0 03/01/2014 2353   MCHC 32.7 05/30/2024 1235   MCHC 32.1 03/01/2014 2353   RDW 13.5 05/30/2024 1235   RDW 13.4 03/01/2014 2353    Nutritional Lab Results  Component Value Date   VD25OH 22.7 (L) 05/30/2024   Lab Results  Component Value Date   VITAMINB12 427 05/30/2024      ASSESSMENT AND PLAN  Class 3 severe obesity with serious comorbidity and body mass index (BMI) of 45.0 to 49.9 in adult, unspecified obesity type (HCC) TREATMENT PLAN FOR OBESITY:  Recommended Dietary Goals  Caige is currently in the action stage of change. As such, his goal is to continue weight management plan. He has agreed to continue 2000 calorie 140 gram protein meal plan   Behavioral Intervention  We discussed the following Behavioral Modification Strategies today: work on meal planning and preparation, continue to work on maintaining a reduced calorie state, getting the recommended amount of protein, incorporating whole foods, making healthy choices, staying well hydrated and practicing mindfulness when eating., and increase protein intake, fibrous foods (25 grams per day for women, 30 grams for men) and water to improve satiety and decrease hunger signals.  Try to limit cheats and control of cravings- addition of Metformin may help   Recommended Physical  Activity Goals  Jonnatan has been advised to work up to 150 minutes of moderate intensity aerobic activity a week and strengthening exercises 2-3 times per week for cardiovascular health, weight loss maintenance and preservation of muscle mass.   He has agreed to Think about enjoyable ways to increase daily physical activity and overcoming barriers to exercise, Increase physical activity in their day and reduce sedentary time (increase NEAT)., Increase volume of physical activity to a goal of 240 minutes a week, and Combine aerobic and strengthening exercises for efficiency and improved cardiometabolic health.   Pharmacotherapy We discussed various medication options to help Krista with his weight loss efforts and we both agreed to start Metformin XR 500 mg 1 tab daily for insulin  resistance/prediabetes and off label for weight loss. Will start Ergocalciferol  50000 units  once a week for 12 weeks for Vit D deficiency and will plan to recheck labs in 08/2024. If at next visit he is still having difficulty controlling cravings may add lomaira  ASSOCIATED CONDITIONS ADDRESSED TODAY  Action/Plan  Vitamin D  deficiency Supplement with Ergocalciferol  50000 units once a week for 12 weeks and then recheck level.   -     Vitamin D  (Ergocalciferol ); Take 1 capsule (50,000 Units total) by mouth every 7 (seven) days.  Dispense: 5 capsule; Refill: 1  Essential hypertension Continue Losartan/hydrochlorothiazide 100/25 mg every day Continue 2000 calorie 140 gram protein meal plan  and DASH diet Monitor BP and if consistently >140/90 notify PCP If develops headaches, chest pain, shortness of breath or dizziness go to ER Loss of 10-15% body weight can help improve blood pressures   Mixed hyperlipidemia Hypertriglyceridemia Continue 2000 calorie 140 gram protein meal plan, limit saturated fats Loss of 10-15% body weight can improve lipid levels Focus on getting 150 minutes a week of moderate to high  intensity exercise   OSA on CPAP       Continue 100 % use of CPAP       Advised of potential to use  Zepbound in future but wants to avoid injectables.   Prediabetes Insulin  resistance 2000 calorie 140 gram protein meal plan, limit simple carbohydrates Decreasing body weight by 10-15% can improve glucose levels Continue exercise with current goal of 150 minutes of moderate to high intensity exercise/week.  Start Metformin XR 500 mg 1 tab daily with largest meal -     metFORMIN HCl ER; Take 1 tablet (500 mg total) by mouth daily with breakfast.  Dispense: 30 tablet; Refill: 0       Return in about 3 weeks (around 07/04/2024).SABRA He was informed of the importance of frequent follow up visits to maximize his success with intensive lifestyle modifications for his multiple health conditions.   ATTESTASTION STATEMENTS:  Reviewed by clinician on day of visit: allergies, medications, problem list, medical history, surgical history, family history, social history, and previous encounter notes.   I personally spent a total of 50 minutes in the care of the patient today including preparing to see the patient, getting/reviewing separately obtained history, performing a medically appropriate exam/evaluation, counseling and educating, placing orders, referring and communicating with other health care professionals, documenting clinical information in the EHR, independently interpreting results, communicating results, and coordinating care.   Kema Santaella ANP-C

## 2024-06-15 ENCOUNTER — Ambulatory Visit (INDEPENDENT_AMBULATORY_CARE_PROVIDER_SITE_OTHER): Admitting: Internal Medicine

## 2024-06-22 ENCOUNTER — Ambulatory Visit (INDEPENDENT_AMBULATORY_CARE_PROVIDER_SITE_OTHER): Admitting: Family Medicine

## 2024-06-29 ENCOUNTER — Ambulatory Visit (INDEPENDENT_AMBULATORY_CARE_PROVIDER_SITE_OTHER): Admitting: Internal Medicine

## 2024-07-04 ENCOUNTER — Ambulatory Visit (INDEPENDENT_AMBULATORY_CARE_PROVIDER_SITE_OTHER): Admitting: Nurse Practitioner

## 2024-07-04 ENCOUNTER — Encounter (INDEPENDENT_AMBULATORY_CARE_PROVIDER_SITE_OTHER): Payer: Self-pay | Admitting: Nurse Practitioner

## 2024-07-04 VITALS — BP 125/72 | HR 63 | Temp 97.5°F | Ht 71.0 in | Wt 355.0 lb

## 2024-07-04 DIAGNOSIS — R7303 Prediabetes: Secondary | ICD-10-CM | POA: Diagnosis not present

## 2024-07-04 DIAGNOSIS — I10 Essential (primary) hypertension: Secondary | ICD-10-CM

## 2024-07-04 DIAGNOSIS — G4733 Obstructive sleep apnea (adult) (pediatric): Secondary | ICD-10-CM

## 2024-07-04 DIAGNOSIS — Z6841 Body Mass Index (BMI) 40.0 and over, adult: Secondary | ICD-10-CM

## 2024-07-04 DIAGNOSIS — E88819 Insulin resistance, unspecified: Secondary | ICD-10-CM

## 2024-07-04 DIAGNOSIS — E559 Vitamin D deficiency, unspecified: Secondary | ICD-10-CM | POA: Diagnosis not present

## 2024-07-04 DIAGNOSIS — E66813 Obesity, class 3: Secondary | ICD-10-CM

## 2024-07-04 MED ORDER — METFORMIN HCL ER 500 MG PO TB24: 500.0000 mg | ORAL_TABLET | Freq: Two times a day (BID) | ORAL | 0 refills | Status: DC

## 2024-07-04 NOTE — Progress Notes (Signed)
 Office: [phone]  /  Fax: [phone]  WEIGHT SUMMARY AND BIOMETRICS  Weight Lost Since Last Visit: 2 lb  Weight Gained Since Last Visit: 0   Vitals Temp: (!) 97.5 F (36.4 C) BP: 125/72 Pulse Rate: 63 SpO2: 97 %   Anthropometric Measurements Height: 5' 11 (1.803 m) Weight: (!) 355 lb (161 kg) BMI (Calculated): 49.53 Weight at Last Visit: 357 lb Weight Lost Since Last Visit: 2 lb Weight Gained Since Last Visit: 0 Starting Weight: 357 lb Total Weight Loss (lbs): 2 lb (0.907 kg) Peak Weight: 385 lb   Body Composition  Body Fat %: 37.9 % Fat Mass (lbs): 34.6 lbs Muscle Mass (lbs): 210.2 lbs Total Body Water (lbs): 149.2 lbs Visceral Fat Rating : 26   Other Clinical Data Fasting: No Labs: No Today's Visit #: 3 Starting Date: 05/30/24    Total Weight Loss: 2 pounds  Bio Impedance Data reviewed with patient: Muscle is up 2.4 pounds, adipose is down 4.4 pounds, PBF decreased from 38.91% to 37.9%, visceral fat rating decreased from 27 to 26.   HPI  Chief Complaint: OBESITY  Joe Snyder is here to discuss his progress with his obesity treatment plan. He is on the 2000 calorie 140 gram protein meal plan and states he is following his eating plan approximately 60 % of the time. He states he is walking 30 minutes 7 days per week and weights 3 days a week for 45 minutes.   Interval History:  Since last office visit he was started on Vit D (ergocalciferol  50000 units once a week for Vit D deficiency and denies side effects.  Also started Metformin XR 500 mg QAM for insulin  resistance.   He continues to have some days that he is struggling.  He has more trouble when he is traveling - he is still stopping at [facility] [place] and does get the bread(930 calories) . He has only been drinking water and is getting 1 gallon of water daily.  He does notice he has more hunger and cravings at night. He does have more energy since starting the VitD.  He has not been journaling  calories or protein.  Breakfast :2 hard boiled eggs Lunch:protein shake Snack: bag of sunflower seeds Dinner: [employer]     PHYSICAL EXAM:  Blood pressure 125/72, pulse 63, temperature (!) 97.5 F (36.4 C), height 5' 11 (1.803 m), weight (!) 355 lb (161 kg), SpO2 97%. Body mass index is 49.51 kg/m.  General: Well Developed, well nourished, and in no acute distress.  HEENT: Normocephalic, atraumatic; EOMI, sclerae are anicteric. Skin: Warm and dry, good turgor Chest:  Normal excursion, shape, no gross ABN Respiratory: No conversational dyspnea; speaking in full sentences NeuroM-Sk:  Normal gross ROM * 4 extremities  Psych: A and O X 3, insight adequate, mood- full    DIAGNOSTIC DATA REVIEWED:  BMET    Component Value Date/Time   NA 140 05/30/2024 1235   NA 138 03/01/2014 2353   K 4.1 05/30/2024 1235   K 4.1 03/01/2014 2353   CL 101 05/30/2024 1235   CL 104 03/01/2014 2353   CO2 22 05/30/2024 1235   CO2 26 03/01/2014 2353   GLUCOSE 83 05/30/2024 1235   GLUCOSE 83 04/13/2017 1200   GLUCOSE 100 (H) 03/01/2014 2353   BUN 15 05/30/2024 1235   BUN 18 03/01/2014 2353   CREATININE 1.01 05/30/2024 1235   CREATININE 1.00 04/13/2017 1200   CALCIUM  9.5 05/30/2024 1235   CALCIUM  9.3 03/01/2014 2353  GFRNONAA >60 03/01/2014 2353   GFRAA >60 03/01/2014 2353   Lab Results  Component Value Date   HGBA1C 5.7 (H) 05/30/2024   HGBA1C 5.4 09/04/2015   Lab Results  Component Value Date   INSULIN  33.7 (H) 05/30/2024   Lab Results  Component Value Date   TSH 0.884 05/30/2024   CBC    Component Value Date/Time   WBC 8.6 05/30/2024 1235   WBC 13.7 (H) 03/01/2014 2353   RBC 5.14 05/30/2024 1235   RBC 5.29 03/01/2014 2353   HGB 14.4 05/30/2024 1235   HCT 44.1 05/30/2024 1235   PLT 190 05/30/2024 1235   MCV 86 05/30/2024 1235   MCV 87 03/01/2014 2353   MCH 28.0 05/30/2024 1235   MCH 28.0 03/01/2014 2353   MCHC 32.7 05/30/2024 1235   MCHC 32.1 03/01/2014 2353   RDW  13.5 05/30/2024 1235   RDW 13.4 03/01/2014 2353   Iron Studies No results found for: IRON, TIBC, FERRITIN, IRONPCTSAT Lipid Panel     Component Value Date/Time   CHOL 162 04/13/2017 1200   TRIG 432 (H) 04/13/2017 1200   HDL 35 (L) 04/13/2017 1200   CHOLHDL 4.6 04/13/2017 1200   VLDL NOT CALC 04/13/2017 1200   LDLCALC NOT CALC 04/13/2017 1200   LDLDIRECT 94.0 03/28/2016 0825   Hepatic Function Panel     Component Value Date/Time   PROT 7.3 05/30/2024 1235   ALBUMIN 4.5 05/30/2024 1235   AST 24 05/30/2024 1235   ALT 28 05/30/2024 1235   ALKPHOS 66 05/30/2024 1235   BILITOT 0.5 05/30/2024 1235      Component Value Date/Time   TSH 0.884 05/30/2024 1235   Nutritional Lab Results  Component Value Date   VD25OH 22.7 (L) 05/30/2024     ASSESSMENT AND PLAN  Class 3 severe obesity with serious comorbidity and body mass index (BMI) of 45.0 to 49.9 in adult, unspecified obesity type (HCC) TREATMENT PLAN FOR OBESITY:  Recommended Dietary Goals  Joe Snyder is currently in the action stage of change. As such, his goal is to continue weight management plan. He has agreed to 2000 calorie 140 gram protein meal plan.  Behavioral Intervention  We discussed the following Behavioral Modification Strategies today: decreasing eating out or consumption of processed foods, and making healthy choices when eating convenient foods, continue to work on maintaining a reduced calorie state, getting the recommended amount of protein, incorporating whole foods, making healthy choices, staying well hydrated and practicing mindfulness when eating., and increase protein intake, fibrous foods (25 grams per day for women, 30 grams for men) and water to improve satiety and decrease hunger signals. Reviewed options when eating out- reviewed different options he has picked  and showed calorie/protein counts of those foods- will work on making healthier choices when eating out.   Recommended Physical  Activity Goals  [name] has been advised to work up to 150 minutes of moderate intensity aerobic activity a week and strengthening exercises 2-3 times per week for cardiovascular health, weight loss maintenance and preservation of muscle mass.   He has agreed to Think about enjoyable ways to increase daily physical activity and overcoming barriers to exercise, Increase physical activity in their day and reduce sedentary time (increase NEAT)., Increase volume of physical activity to a goal of 240 minutes a week, and Combine aerobic and strengthening exercises for efficiency and improved cardiometabolic health.   Pharmacotherapy We discussed various medication options to help Joe Snyder with his weight loss efforts and we both agreed  to increase Metformin XR 500 mg to BID for insulin  resistance. Continue Ergocalciferol  50000 units once a week.  Denies side effects to both medications.   ASSOCIATED CONDITIONS ADDRESSED TODAY  Action/Plan  Insulin  resistance Prediabetes Continue 2000 calorie 140 gram protein meal plan, limit simple carbohydrates Continue exercise with current goal of 150 minutes of moderate to high intensity exercise/week and strength training 3 days a week Increase Metformin ER 500 mg to BID.  -     metFORMIN HCl ER; Take 1 tablet (500 mg total) by mouth 2 (two) times daily with a meal.  Dispense: 60 tablet; Refill: 0  Vitamin D  deficiency Continue to supplement with Ergocalciferol  50000 units once a week- denies side effects. Has started to notice an increase in energy  Essential hypertension Continue Hyzaar 100/25 mg every day Continue 2000 calorie, 140 gram protein meal planand DASH diet Monitor BP and if consistently >140/90 notify PCP If develops headaches, chest pain, shortness of breath or dizziness go to ER  OSA on CPAP       Use CPAP 100% of the time       Decreasing body weight by 10-15% can improve AHI      Return in about 4 weeks (around 08/01/2024).SABRA He  was informed of the importance of frequent follow up visits to maximize his success with intensive lifestyle modifications for his multiple health conditions.   ATTESTASTION STATEMENTS:  Reviewed by clinician on day of visit: allergies, medications, problem list, medical history, surgical history, family history, social history, and previous encounter notes.   I personally spent a total of 43 minutes in the care of the patient today including preparing to see the patient, getting/reviewing separately obtained history, performing a medically appropriate exam/evaluation, counseling and educating, placing orders, documenting clinical information in the EHR, and coordinating care.   Siria Calandro ANP-C

## 2024-07-10 ENCOUNTER — Other Ambulatory Visit (INDEPENDENT_AMBULATORY_CARE_PROVIDER_SITE_OTHER): Payer: Self-pay | Admitting: Nurse Practitioner

## 2024-07-10 DIAGNOSIS — E88819 Insulin resistance, unspecified: Secondary | ICD-10-CM

## 2024-07-10 DIAGNOSIS — R7303 Prediabetes: Secondary | ICD-10-CM

## 2024-07-31 ENCOUNTER — Other Ambulatory Visit (INDEPENDENT_AMBULATORY_CARE_PROVIDER_SITE_OTHER): Payer: Self-pay | Admitting: Nurse Practitioner

## 2024-07-31 DIAGNOSIS — E88819 Insulin resistance, unspecified: Secondary | ICD-10-CM

## 2024-07-31 DIAGNOSIS — R7303 Prediabetes: Secondary | ICD-10-CM

## 2024-08-01 ENCOUNTER — Ambulatory Visit (INDEPENDENT_AMBULATORY_CARE_PROVIDER_SITE_OTHER): Payer: Self-pay | Admitting: Nurse Practitioner

## 2024-08-07 ENCOUNTER — Other Ambulatory Visit (INDEPENDENT_AMBULATORY_CARE_PROVIDER_SITE_OTHER): Payer: Self-pay | Admitting: Nurse Practitioner

## 2024-08-07 DIAGNOSIS — E559 Vitamin D deficiency, unspecified: Secondary | ICD-10-CM

## 2024-08-15 ENCOUNTER — Other Ambulatory Visit (INDEPENDENT_AMBULATORY_CARE_PROVIDER_SITE_OTHER): Payer: Self-pay | Admitting: Nurse Practitioner

## 2024-08-15 DIAGNOSIS — E88819 Insulin resistance, unspecified: Secondary | ICD-10-CM

## 2024-08-15 DIAGNOSIS — R7303 Prediabetes: Secondary | ICD-10-CM

## 2024-08-16 ENCOUNTER — Encounter (INDEPENDENT_AMBULATORY_CARE_PROVIDER_SITE_OTHER): Payer: Self-pay | Admitting: Nurse Practitioner

## 2024-08-16 ENCOUNTER — Ambulatory Visit (INDEPENDENT_AMBULATORY_CARE_PROVIDER_SITE_OTHER): Admitting: Nurse Practitioner

## 2024-08-16 VITALS — BP 138/72 | HR 79 | Temp 98.2°F | Ht 71.0 in | Wt 353.0 lb

## 2024-08-16 DIAGNOSIS — E559 Vitamin D deficiency, unspecified: Secondary | ICD-10-CM

## 2024-08-16 DIAGNOSIS — E88819 Insulin resistance, unspecified: Secondary | ICD-10-CM

## 2024-08-16 DIAGNOSIS — Z6841 Body Mass Index (BMI) 40.0 and over, adult: Secondary | ICD-10-CM

## 2024-08-16 DIAGNOSIS — I10 Essential (primary) hypertension: Secondary | ICD-10-CM

## 2024-08-16 DIAGNOSIS — R7303 Prediabetes: Secondary | ICD-10-CM

## 2024-08-16 MED ORDER — METFORMIN HCL ER 500 MG PO TB24: 500.0000 mg | ORAL_TABLET | Freq: Two times a day (BID) | ORAL | 1 refills | Status: DC

## 2024-08-16 MED ORDER — VITAMIN D (ERGOCALCIFEROL) 1.25 MG (50000 UNIT) PO CAPS: 50000.0000 [IU] | ORAL_CAPSULE | ORAL | 1 refills | Status: DC

## 2024-08-16 MED ORDER — PHENTERMINE HCL 37.5 MG PO TABS: ORAL_TABLET | ORAL | Status: DC

## 2024-08-16 MED ORDER — PHENTERMINE HCL 37.5 MG PO TABS: 37.5000 mg | ORAL_TABLET | Freq: Every day | ORAL | 0 refills | Status: DC

## 2024-08-16 NOTE — Progress Notes (Signed)
 Office: [phone]  /  Fax: [phone]  WEIGHT SUMMARY AND BIOMETRICS  Weight Lost Since Last Visit: 2lb  Weight Gained Since Last Visit: 0lb   Vitals Temp: 98.2 F (36.8 C) BP: 138/72 Pulse Rate: 79 SpO2: 99 %   Anthropometric Measurements Height: 5' 11 (1.803 m) Weight: (!) 353 lb (160.1 kg) BMI (Calculated): 49.26 Weight at Last Visit: 355lb Weight Lost Since Last Visit: 2lb Weight Gained Since Last Visit: 0lb Starting Weight: 357lb Total Weight Loss (lbs): 4 lb (1.814 kg) Peak Weight: 385lb   Body Composition  Body Fat %: 38.2 % Fat Mass (lbs): 134.8 lbs Muscle Mass (lbs): 207.6 lbs Total Body Water (lbs): 150.4 lbs Visceral Fat Rating : 26   Other Clinical Data Fasting: Yes Labs: No Today's Visit #: 4 Starting Date: 05/30/24    Total Weight Loss: 4 pounds  Bio Impedance Data reviewed with patient:Muscle is down 2.8 pounds and adipose is down 0.2 pounds.   HPI  Chief Complaint: OBESITY  Arick is here to discuss his progress with his obesity treatment plan. He is on 2200 calorie 140 gram protein and lower carb category meal plan  and states he is following his eating plan approximately 40 % of the time. He states he is exercising 60 minutes 5 days per week.   Interval History:  Since last office visit he has noticed that when he eats sweets on Metformin  he does not feel well.  He has to travel from [place]  to [place] .  Thanksgiving went well- smaller portions and 1 place.  He has been traveling a lot and just got back from [place]  so having to eat out more often and eating more off plan. He is not eating all of his portions, normally would eat it all and more.  He tracked his calories for a week. Dropped his weight 10 pounds that week. Gained 8 pounds back over the past 3 weeks. Only drank water and no sweet drinks. Continues to craves foods later at night.   Ravon does have hypertension and his BP's are currently well controlled on  Losartan/hydrochlorothiazide 100/25 mg every day. Denies headaches, chest pain shortness of breath at rest and dizziness BP Readings from Last 3 Encounters:  08/16/24 138/72  07/04/24 125/72  06/13/24 135/75   He continues on Rosuvastatin 20 mg every day for hyperlipidemia and denies side effects with the medication.  Last lipid panel was done through [facility] so not visible in our system. Will plan to recheck at 6 months.    He continues on Metformin  XR 500 mg BID for insulin  resistance/prediabetes and denies side effects Lab Results  Component Value Date   HGBA1C 5.7 (H) 05/30/2024    He also takes Ergocalciferol  50000 units once a week for Vit D deficiency and denies side effects.  Last vitamin D  Lab Results  Component Value Date   VD25OH 22.7 (L) 05/30/2024     PHYSICAL EXAM:  Blood pressure 138/72, pulse 79, temperature 98.2 F (36.8 C), height 5' 11 (1.803 m), weight (!) 353 lb (160.1 kg), SpO2 99%. Body mass index is 49.23 kg/m.  General: Well Developed, well nourished, and in no acute distress.  HEENT: Normocephalic, atraumatic; EOMI, sclerae are anicteric. Skin: Warm and dry, good turgor Chest:  Normal excursion, shape, no gross ABN Respiratory: No conversational dyspnea; speaking in full sentences NeuroM-Sk:  Normal gross ROM * 4 extremities  Psych: A and O X 3, insight adequate, mood- full     ASSESSMENT AND PLAN  Class 3 severe obesity with serious comorbidity and body mass index (BMI) of 45.0 to 49.9 in adult, unspecified obesity type (HCC)  TREATMENT PLAN FOR OBESITY:  Recommended Dietary Goals  Abubakar is currently in the action stage of change. As such, his goal is to continue weight management plan. He has agreed to 2200 calorie 140 gram protein and lower carb category meal plan    Behavioral Intervention  We discussed the following Behavioral Modification Strategies today: increasing lean protein intake to established goals, decreasing  simple carbohydrates , increasing fiber rich foods, work on meal planning and preparation, work on tracking and journaling calories , continue to work on research officer, [employer] of reduced calorie nutritional plan, better snacking choices, and celebration eating strategies.  He plans to maintain her weight in December - He wants to have small indulgences, but he is not going to waste his calories on things that are not wonderful. - Outside of social situations he will continue to follow a structured eating plan but enjoy himself with portion control for holiday events, parties and get-togethers - She does recognize that this strategy is to help him maintain his weight, not lose weight and in January he will return to a structured plan for weight loss   Recommended Physical Activity Goals  Kasey has been advised to work up to 150 minutes of moderate intensity aerobic activity a week and strengthening exercises 2-3 times per week for cardiovascular health, weight loss maintenance and preservation of muscle mass.   He has agreed to Think about enjoyable ways to increase daily physical activity and overcoming barriers to exercise, Increase physical activity in their day and reduce sedentary time (increase NEAT)., Increase volume of physical activity to a goal of 240 minutes a week, and Combine aerobic and strengthening exercises for efficiency and improved cardiometabolic health.   Pharmacotherapy We discussed various medication options to help Winn with his weight loss efforts and we both agreed to [facility] Phentermine  37.5 mg 1 tab PO every day- signed contract and counseled on side effects. Continue Metformin  XR 500 mg BID for prediabetes and insulin  resistance, denies side effects. Continue Ergocalciferol  50000 units once a week for Vit D deficiency- denies side effects.  ASSOCIATED CONDITIONS ADDRESSED TODAY  Action/Plan  Essential Hypertension Continue Losartan/hydrochlorothiazide 100/25 mg  every  day Continue 2200 calorie 140 gram protein and lower carb category meal plan   and DASH diet Monitor BP and if consistently >140/90 notify PCP If develops headaches, chest pain, shortness of breath or dizziness go to ER Continue to follow regularly with PCP   Class 3 severe obesity with serious comorbidity and body mass index (BMI) of 45.0 to 49.9 in adult, unspecified obesity type (HCC) Refer to plan above -     Phentermine  HCl; Take 1 tablet (37.5 mg total) by mouth daily before breakfast. Take 1/2 to 1 tablet every morning for dieting & weightloss  Dispense: 30 tablet; Refill: 0  Insulin  resistance Prediabetes Continue 2200 calorie 140 gram protein and lower carb category meal plan, limit simple carbohydrates, increase fiber Monitor for signs of hypoglycemia Continue to follow regularly with PCP Continue Metformin  XR 500 mg BID- denies side effects, eating less sweets Continue exercise with current goal of 240 minutes of moderate to high intensity exercise/week.  -     metFORMIN  HCl ER; Take 1 tablet (500 mg total) by mouth 2 (two) times daily with a meal.  Dispense: 60 tablet; Refill: 1  Vitamin D  deficiency Continue to supplement with Ergocalciferol  50000  units once a week  Low vitamin D  levels can be associated with adiposity and may result in leptin resistance and weight gain. Also associated with fatigue.  Currently on vitamin D  supplementation without any adverse effects such as nausea, vomiting or muscle weakness.   -     Vitamin D  (Ergocalciferol ); Take 1 capsule (50,000 Units total) by mouth every 7 (seven) days.  Dispense: 5 capsule; Refill: 1         Return in about 4 weeks (around 09/13/2024).SABRA He was informed of the importance of frequent follow up visits to maximize his success with intensive lifestyle modifications for his multiple health conditions.   ATTESTASTION STATEMENTS:  Reviewed by clinician on day of visit: allergies, medications, problem list, medical  history, surgical history, family history, social history, and previous encounter notes.   I personally spent a total of 48 minutes in the care of the patient today including preparing to see the patient, getting/reviewing separately obtained history, performing a medically appropriate exam/evaluation, counseling and educating, placing orders, and documenting clinical information in the EHR.   Ariaunna Longsworth ANP-C

## 2024-09-14 ENCOUNTER — Other Ambulatory Visit (INDEPENDENT_AMBULATORY_CARE_PROVIDER_SITE_OTHER): Payer: Self-pay | Admitting: Nurse Practitioner

## 2024-09-14 DIAGNOSIS — Z6841 Body Mass Index (BMI) 40.0 and over, adult: Secondary | ICD-10-CM

## 2024-09-15 ENCOUNTER — Encounter (INDEPENDENT_AMBULATORY_CARE_PROVIDER_SITE_OTHER): Payer: Self-pay | Admitting: Nurse Practitioner

## 2024-09-15 ENCOUNTER — Ambulatory Visit (INDEPENDENT_AMBULATORY_CARE_PROVIDER_SITE_OTHER): Admitting: Nurse Practitioner

## 2024-09-15 VITALS — BP 134/76 | HR 89 | Temp 97.5°F | Ht 71.0 in | Wt 331.0 lb

## 2024-09-15 DIAGNOSIS — E559 Vitamin D deficiency, unspecified: Secondary | ICD-10-CM

## 2024-09-15 DIAGNOSIS — I10 Essential (primary) hypertension: Secondary | ICD-10-CM

## 2024-09-15 DIAGNOSIS — E88819 Insulin resistance, unspecified: Secondary | ICD-10-CM | POA: Diagnosis not present

## 2024-09-15 DIAGNOSIS — G4733 Obstructive sleep apnea (adult) (pediatric): Secondary | ICD-10-CM

## 2024-09-15 DIAGNOSIS — E66813 Obesity, class 3: Secondary | ICD-10-CM | POA: Diagnosis not present

## 2024-09-15 DIAGNOSIS — Z6841 Body Mass Index (BMI) 40.0 and over, adult: Secondary | ICD-10-CM

## 2024-09-15 MED ORDER — PHENTERMINE HCL 37.5 MG PO TABS: 37.5000 mg | ORAL_TABLET | Freq: Every day | ORAL | 0 refills | Status: DC

## 2024-09-15 NOTE — Progress Notes (Signed)
 " Office: [phone]  /  Fax: [phone]  WEIGHT SUMMARY AND BIOMETRICS  Weight Lost Since Last Visit: 22 lb  Weight Gained Since Last Visit: 0   Vitals Temp: (!) 97.5 F (36.4 C) BP: 134/76 Pulse Rate: 89 SpO2: 96 %   Anthropometric Measurements Height: 5' 11 (1.803 m) Weight: (!) 331 lb (150.1 kg) BMI (Calculated): 46.19 Weight at Last Visit: 353 LB Weight Lost Since Last Visit: 22 lb Weight Gained Since Last Visit: 0 Starting Weight: 357 LB Total Weight Loss (lbs): 26 lb (11.8 kg) Peak Weight: 385 LB   Body Composition  Body Fat %: 35.5 % Fat Mass (lbs): 117.8 lbs Muscle Mass (lbs): 203.6 lbs Total Body Water (lbs): 141.6 lbs Visceral Fat Rating : 23   Other Clinical Data Fasting: NO Labs: NO Today's Visit #: 5 Starting Date: 05/30/24    Total Weight Loss: 26 pounds Percentage of body weight LOST: 7% Bio Impedance Data reviewed with patient: Muscle is down 4 pounds, adipose is down 17 pounds. Visceral fat rating decreased 3 points from 26 to 23  HPI  Chief Complaint: OBESITY  Joe Snyder is here to discuss his progress with his obesity treatment plan. He is on the keeping a food journal and adhering to recommended goals of 2200 calories and 140grams protein and states he is following his eating plan approximately 60 % of the time. He states he is exercising 60 minutes 5 days per week walking and some weights.   Interval History:  Since last office visit he has been working out more since starting the Phentermine . When he travels he has been looking for [employer] and exercising . He wakes up and walks a mile on the treadmill and does again at night with some weights.Making better choices when he travels. He has been getting his protein in and has increased fruits and vegetables.  Has stopped eating at 6 PM. He is doing a lot of research on insulin  resistance.  Breakfast- protein shake Lunch: taco salad with ground turkey/hamburger mix Dinner:  chicken and vegetables  Joe Snyder does have hypertension and his BP's are currently well controlled on Losartan /hydrochlorothiazide 100/25 mg every day. Denies headaches, chest pain shortness of breath at rest and dizziness  BP Readings from Last 3 Encounters:  09/15/24 134/76  08/16/24 138/72  07/04/24 125/72   He continue on Rosuvastatin 20 mg every day for hyperlipidemia.  He is also working on nutrition, exercise and weight loss to lower lipid levels  He continues on Metformin  XR 500 mg BID for insulin  resistance and denies side effects.  He continues to be treated with Ergocalciferol  50000 units once a week for Vit D deficiency and denies side effects.   Pharmacotherapy for weight loss: He is currently taking Phentermine  37.5 mg every day for medical weight loss.  Denies side effects.   PHYSICAL EXAM:  Blood pressure 134/76, pulse 89, temperature (!) 97.5 F (36.4 C), height 5' 11 (1.803 m), weight (!) 331 lb (150.1 kg), SpO2 96%. Body mass index is 46.17 kg/m.  General: Well Developed, well nourished, and in no acute distress.  HEENT: Normocephalic, atraumatic; EOMI, sclerae are anicteric. Skin: Warm and dry, good turgor Chest:  Normal excursion, shape, no gross ABN Respiratory: No conversational dyspnea; speaking in full sentences NeuroM-Sk:  Normal gross ROM * 4 extremities  Psych: A and O X 3, insight adequate, mood- full    DIAGNOSTIC DATA REVIEWED:  BMET    Component Value Date/Time   NA 140 05/30/2024  1235   NA 138 03/01/2014 2353   K 4.1 05/30/2024 1235   K 4.1 03/01/2014 2353   CL 101 05/30/2024 1235   CL 104 03/01/2014 2353   CO2 22 05/30/2024 1235   CO2 26 03/01/2014 2353   GLUCOSE 83 05/30/2024 1235   GLUCOSE 83 04/13/2017 1200   GLUCOSE 100 (H) 03/01/2014 2353   BUN 15 05/30/2024 1235   BUN 18 03/01/2014 2353   CREATININE 1.01 05/30/2024 1235   CREATININE 1.00 04/13/2017 1200   CALCIUM  9.5 05/30/2024 1235   CALCIUM  9.3 03/01/2014 2353   GFRNONAA  >60 03/01/2014 2353   GFRAA >60 03/01/2014 2353   Lab Results  Component Value Date   HGBA1C 5.7 (H) 05/30/2024   HGBA1C 5.4 09/04/2015   Lab Results  Component Value Date   INSULIN  33.7 (H) 05/30/2024   Lab Results  Component Value Date   TSH 0.884 05/30/2024   CBC    Component Value Date/Time   WBC 8.6 05/30/2024 1235   WBC 13.7 (H) 03/01/2014 2353   RBC 5.14 05/30/2024 1235   RBC 5.29 03/01/2014 2353   HGB 14.4 05/30/2024 1235   HCT 44.1 05/30/2024 1235   PLT 190 05/30/2024 1235   MCV 86 05/30/2024 1235   MCV 87 03/01/2014 2353   MCH 28.0 05/30/2024 1235   MCH 28.0 03/01/2014 2353   MCHC 32.7 05/30/2024 1235   MCHC 32.1 03/01/2014 2353   RDW 13.5 05/30/2024 1235   RDW 13.4 03/01/2014 2353   Iron Studies No results found for: IRON, TIBC, FERRITIN, IRONPCTSAT Lipid Panel     Component Value Date/Time   CHOL 162 04/13/2017 1200   TRIG 432 (H) 04/13/2017 1200   HDL 35 (L) 04/13/2017 1200   CHOLHDL 4.6 04/13/2017 1200   VLDL NOT CALC 04/13/2017 1200   LDLCALC NOT CALC 04/13/2017 1200   LDLDIRECT 94.0 03/28/2016 0825   Hepatic Function Panel     Component Value Date/Time   PROT 7.3 05/30/2024 1235   ALBUMIN 4.5 05/30/2024 1235   AST 24 05/30/2024 1235   ALT 28 05/30/2024 1235   ALKPHOS 66 05/30/2024 1235   BILITOT 0.5 05/30/2024 1235      Component Value Date/Time   TSH 0.884 05/30/2024 1235   Nutritional Lab Results  Component Value Date   VD25OH 22.7 (L) 05/30/2024     ASSESSMENT AND PLAN Class 3 severe obesity with serious comorbidity and body mass index (BMI) of 45.0 to 49.9 in adult, unspecified obesity type (HCC) TREATMENT PLAN FOR OBESITY:  Recommended Dietary Goals  Joe Snyder is currently in the action stage of change. As such, his goal is to continue weight management plan. He has agreed to  adhering to recommended goals of 2200 calories and 140 gram of protein.  Behavioral Intervention  We discussed the following Behavioral  Modification Strategies today: avoiding skipping meals, reading food labels , keeping healthy foods at home, decreasing eating out or consumption of processed foods, and making healthy choices when eating convenient foods, continue to work on maintaining a reduced calorie state, getting the recommended amount of protein, incorporating whole foods, making healthy choices, staying well hydrated and practicing mindfulness when eating., and increase protein intake, fibrous foods (25 grams per day for women, 30 grams for men) and water to improve satiety and decrease hunger signals. . Recommended Physical Activity Goals  Joe Snyder has been advised to work up to 150 minutes of moderate intensity aerobic activity a week and strengthening exercises 2-3 times per week  for cardiovascular health, weight loss maintenance and preservation of muscle mass.   He has agreed to Increase volume of physical activity to a goal of 240 minutes a week and Combine aerobic and strengthening exercises for efficiency and improved cardiometabolic health.   Pharmacotherapy We discussed various medication options to help Joe Snyder with his weight loss efforts and we both agreed to continue Phentermine  37.5 mg 1 tab daily- denies side effects. Patient was counseled on the importance of maintaining healthy lifestyle habits, including balanced nutrition, regular physical activity, and behavioral modifications, while taking antiobesity medication.  Patient verbalized understanding that medication is an adjunct to, not a replacement for, lifestyle changes and that the long-term success and weight maintenance depend on continued adherence to these strategies.  I have consulted the [place] Controlled Substances Registry for this patient, and feel the risk/benefit ratio today is favorable for proceeding with this prescription for a controlled substance. No aberrancies noted.   ASSOCIATED CONDITIONS ADDRESSED TODAY  Action/Plan  Essential  hypertension Continue Losartan /hydrochlorothiazide 100/25 mg every day Continue 2200 calories and 140 gram of protein meal plan  and DASH diet Monitor BP and if consistently >140/90 notify PCP If develops headaches, chest pain, shortness of breath or dizziness go to ER Continue to follow regularly with PCP  Insulin  resistance Focus on implementing 2200 calories and 140 gram of protein meal plan, limit saturated fats. Increase lean protein, fiber and water Continue Rosuvastatin 20 mg every day- follow regularly with PCP Focus on getting 150 minutes a week of moderate to high intensity exercise with strength training.   OSA on CPAP       Use CPAP 100% of the time       Decreasing body weight by 10-15% can improve AHI   Vitamin D  deficiency       Continue to supplement with Ergocalciferol  50000 units once a week        Low vitamin D  levels can be associated with adiposity and may result in leptin resistance and weight gain. Also associated with fatigue.        Currently on vitamin D  supplementation without any adverse effects such as nausea, vomiting or muscle weakness.   Class 3 severe obesity with serious comorbidity and body mass index (BMI) of 45.0 to 49.9 in adult, unspecified obesity type (HCC) See plan above -     Phentermine  HCl; Take 1 tablet (37.5 mg total) by mouth daily before breakfast. Take 1/2 to 1 tablet every morning for dieting & weightloss  Dispense: 30 tablet; Refill: 0         Return in about 4 weeks (around 10/13/2024).SABRA He was informed of the importance of frequent follow up visits to maximize his success with intensive lifestyle modifications for his multiple health conditions.   ATTESTASTION STATEMENTS:  Reviewed by clinician on day of visit: allergies, medications, problem list, medical history, surgical history, family history, social history, and previous encounter notes.   I personally spent a total of 45 minutes in the care of the patient today including  preparing to see the patient, getting/reviewing separately obtained history, performing a medically appropriate exam/evaluation, counseling and educating, placing orders, and documenting clinical information in the EHR.   Lonell Liverpool ANP-C "

## 2024-10-07 ENCOUNTER — Other Ambulatory Visit (INDEPENDENT_AMBULATORY_CARE_PROVIDER_SITE_OTHER): Payer: Self-pay | Admitting: Nurse Practitioner

## 2024-10-07 DIAGNOSIS — E559 Vitamin D deficiency, unspecified: Secondary | ICD-10-CM

## 2024-10-09 ENCOUNTER — Other Ambulatory Visit (INDEPENDENT_AMBULATORY_CARE_PROVIDER_SITE_OTHER): Payer: Self-pay | Admitting: Nurse Practitioner

## 2024-10-09 DIAGNOSIS — R7303 Prediabetes: Secondary | ICD-10-CM

## 2024-10-09 DIAGNOSIS — E88819 Insulin resistance, unspecified: Secondary | ICD-10-CM

## 2024-10-13 ENCOUNTER — Ambulatory Visit (INDEPENDENT_AMBULATORY_CARE_PROVIDER_SITE_OTHER): Admitting: Nurse Practitioner

## 2024-10-13 ENCOUNTER — Encounter (INDEPENDENT_AMBULATORY_CARE_PROVIDER_SITE_OTHER): Payer: Self-pay | Admitting: Nurse Practitioner

## 2024-10-13 VITALS — BP 134/78 | HR 89 | Temp 98.2°F | Ht 71.0 in | Wt 321.0 lb

## 2024-10-13 DIAGNOSIS — E66813 Obesity, class 3: Secondary | ICD-10-CM

## 2024-10-13 DIAGNOSIS — Z6841 Body Mass Index (BMI) 40.0 and over, adult: Secondary | ICD-10-CM | POA: Diagnosis not present

## 2024-10-13 DIAGNOSIS — E88819 Insulin resistance, unspecified: Secondary | ICD-10-CM

## 2024-10-13 DIAGNOSIS — R7303 Prediabetes: Secondary | ICD-10-CM

## 2024-10-13 DIAGNOSIS — E559 Vitamin D deficiency, unspecified: Secondary | ICD-10-CM

## 2024-10-13 DIAGNOSIS — I10 Essential (primary) hypertension: Secondary | ICD-10-CM

## 2024-10-13 MED ORDER — VITAMIN D (ERGOCALCIFEROL) 1.25 MG (50000 UNIT) PO CAPS: 50000.0000 [IU] | ORAL_CAPSULE | ORAL | 0 refills | Status: AC

## 2024-10-13 MED ORDER — PHENTERMINE HCL 37.5 MG PO TABS: 37.5000 mg | ORAL_TABLET | Freq: Every day | ORAL | 0 refills | Status: DC

## 2024-10-13 MED ORDER — METFORMIN HCL ER 500 MG PO TB24: 500.0000 mg | ORAL_TABLET | Freq: Two times a day (BID) | ORAL | 0 refills | Status: AC

## 2024-10-13 MED ORDER — PHENTERMINE HCL 37.5 MG PO TABS: 37.5000 mg | ORAL_TABLET | Freq: Every day | ORAL | 0 refills | Status: AC

## 2024-10-13 NOTE — Progress Notes (Signed)
 " Office: [phone]  /  Fax: [phone]  WEIGHT SUMMARY AND BIOMETRICS  Weight Lost Since Last Visit: 10lb  Weight Gained Since Last Visit: 0lb   Vitals Temp: 98.2 F (36.8 C) BP: 134/78 Pulse Rate: 89 SpO2: 99 %   Anthropometric Measurements Height: 5' 11 (1.803 m) Weight: (!) 321 lb (145.6 kg) BMI (Calculated): 44.79 Weight at Last Visit: 331lb Weight Lost Since Last Visit: 10lb Weight Gained Since Last Visit: 0lb Starting Weight: 357lb Total Weight Loss (lbs): 36 lb (16.3 kg) Peak Weight: 385lb   Body Composition  Body Fat %: 34.5 % Fat Mass (lbs): 110.8 lbs Muscle Mass (lbs): 200.2 lbs Total Body Water (lbs): 142 lbs Visceral Fat Rating : 22   Other Clinical Data Fasting: no Labs: no Today's Visit #: 6 Starting Date: 05/30/24    Total Weight Loss: 36 pounds Percent of body weight lost: 10%   Bio Impedance Data reviewed with patient: Muscle is down 3.4 pounds, adipose is down 7 pounds Overall loss of 7.6 pounds of muscle and 37.8 pounds of adipose HPI  Chief Complaint: OBESITY  Joe Snyder is here to discuss his progress with his obesity treatment plan. He is on a 2200 calorie 140 gram protein meal plan and states he is following his eating plan approximately 60 % of the time. He states he is exercising 30 minutes 7 days per week.   Interval History:  Since last office visit he has more late night cravings.  He has been getting close to 140 grams of protein daily. Continue to drink at least 80 ounces of water.  Continue to do more fruits and vegetables.  He was in [place]  for work and took his family. Had some ice cream and corn dog but felt nauseated afterwards.  He is walking every day- part of his routine.  He has a high deductible plan and needs to space his visits to every 6-8 weeks.   Joe Snyder does have hypertension and his BP's are currently well controlled on Losartan /hydrochlorothiazide 100/25 mg every day. His BP has been running  120's/70's at home. Denies headaches, chest pain, shortness of breath at rest and dizziness. BP Readings from Last 3 Encounters:  10/13/24 134/78  09/15/24 134/76  08/16/24 138/72   He continues on Metformin  XR 500 mg BID for prediabetes and insulin  resistance, denies side effects. Lab Results  Component Value Date   HGBA1C 5.7 (H) 05/30/2024    Lab Results  Component Value Date   INSULIN  33.7 (H) 05/30/2024   He continues on Ergocalciferol  50000 units once a week for Vit D deficiency and denies side effects.  Last vitamin D  Lab Results  Component Value Date   VD25OH 22.7 (L) 05/30/2024     Pharmacotherapy for weight loss: He is currently taking Phentermine  37.5 mg every day for medical weight loss.  Denies side effects.      PHYSICAL EXAM:  Blood pressure 134/78, pulse 89, temperature 98.2 F (36.8 C), height 5' 11 (1.803 m), weight (!) 321 lb (145.6 kg), SpO2 99%. Body mass index is 44.77 kg/m.  General: Well Developed, well nourished, and in no acute distress.  HEENT: Normocephalic, atraumatic; EOMI, sclerae are anicteric. Skin: Warm and dry, good turgor Chest:  Normal excursion, shape, no gross ABN Respiratory: No conversational dyspnea; speaking in full sentences NeuroM-Sk:  Normal gross ROM * 4 extremities  Psych: A and O X 3, insight adequate, mood- full    DIAGNOSTIC DATA REVIEWED:  BMET    Component Value  Date/Time   NA 140 05/30/2024 1235   NA 138 03/01/2014 2353   K 4.1 05/30/2024 1235   K 4.1 03/01/2014 2353   CL 101 05/30/2024 1235   CL 104 03/01/2014 2353   CO2 22 05/30/2024 1235   CO2 26 03/01/2014 2353   GLUCOSE 83 05/30/2024 1235   GLUCOSE 83 04/13/2017 1200   GLUCOSE 100 (H) 03/01/2014 2353   BUN 15 05/30/2024 1235   BUN 18 03/01/2014 2353   CREATININE 1.01 05/30/2024 1235   CREATININE 1.00 04/13/2017 1200   CALCIUM  9.5 05/30/2024 1235   CALCIUM  9.3 03/01/2014 2353   GFRNONAA >60 03/01/2014 2353   GFRAA >60 03/01/2014 2353   Lab  Results  Component Value Date   HGBA1C 5.7 (H) 05/30/2024   HGBA1C 5.4 09/04/2015   Lab Results  Component Value Date   INSULIN  33.7 (H) 05/30/2024   Lab Results  Component Value Date   TSH 0.884 05/30/2024   CBC    Component Value Date/Time   WBC 8.6 05/30/2024 1235   WBC 13.7 (H) 03/01/2014 2353   RBC 5.14 05/30/2024 1235   RBC 5.29 03/01/2014 2353   HGB 14.4 05/30/2024 1235   HCT 44.1 05/30/2024 1235   PLT 190 05/30/2024 1235   MCV 86 05/30/2024 1235   MCV 87 03/01/2014 2353   MCH 28.0 05/30/2024 1235   MCH 28.0 03/01/2014 2353   MCHC 32.7 05/30/2024 1235   MCHC 32.1 03/01/2014 2353   RDW 13.5 05/30/2024 1235   RDW 13.4 03/01/2014 2353   Iron Studies No results found for: IRON, TIBC, FERRITIN, IRONPCTSAT Lipid Panel     Component Value Date/Time   CHOL 162 04/13/2017 1200   TRIG 432 (H) 04/13/2017 1200   HDL 35 (L) 04/13/2017 1200   CHOLHDL 4.6 04/13/2017 1200   VLDL NOT CALC 04/13/2017 1200   LDLCALC NOT CALC 04/13/2017 1200   LDLDIRECT 94.0 03/28/2016 0825   Hepatic Function Panel     Component Value Date/Time   PROT 7.3 05/30/2024 1235   ALBUMIN 4.5 05/30/2024 1235   AST 24 05/30/2024 1235   ALT 28 05/30/2024 1235   ALKPHOS 66 05/30/2024 1235   BILITOT 0.5 05/30/2024 1235      Component Value Date/Time   TSH 0.884 05/30/2024 1235   Nutritional Lab Results  Component Value Date   VD25OH 22.7 (L) 05/30/2024     ASSESSMENT AND PLAN Class 3 severe obesity with serious comorbidity and body mass index (BMI) of 40.0 to 44.9 in adult, unspecified obesity type (HCC) TREATMENT PLAN FOR OBESITY:  Recommended Dietary Goals  Joe Snyder is currently in the action stage of change. As such, his goal is to continue weight management plan. He has agreed to 2200 calorie 140 gram protein meal plan  Behavioral Intervention  We discussed the following Behavioral Modification Strategies today: continue to work on maintaining a reduced calorie state,  getting the recommended amount of protein, incorporating whole foods, making healthy choices, staying well hydrated and practicing mindfulness when eating. and increase protein intake, fibrous foods (25 grams per day for women, 30 grams for men) and water to improve satiety and decrease hunger signals. .    Recommended Physical Activity Goals  Joe Snyder has been advised to work up to 150 minutes of moderate intensity aerobic activity a week and strengthening exercises 2-3 times per week for cardiovascular health, weight loss maintenance and preservation of muscle mass.   He has agreed to Increase volume of physical activity to a goal  of 240 minutes a week and Combine aerobic and strengthening exercises for efficiency and improved cardiometabolic health.   Pharmacotherapy We discussed various medication options to help Joe Snyder with his weight loss efforts and we both agreed to continue Phentermine  37.5 mg every day . I have consulted the [place] Controlled Substances Registry for this patient, and feel the risk/benefit ratio today is favorable for proceeding with this prescription for a controlled substance. No aberrancies noted.  Patient was counseled on the importance of maintaining healthy lifestyle habits, including balanced nutrition, regular physical activity, and behavioral modifications, while taking antiobesity medication.  Patient verbalized understanding that medication is an adjunct to, not a replacement for, lifestyle changes and that the long-term success and weight maintenance depend on continued adherence to these strategies. .  ASSOCIATED CONDITIONS ADDRESSED TODAY  Action/Plan  Insulin  resistance Continue 2200 calorie 140 gram protein meal plan, limit simple carbohydrates Continue Metformin  XR 500 mg BID and monitor for side effects Continue exercise with current goal of 240 minutes of moderate to high intensity exercise/week with strength training.  -     metFORMIN  HCl ER; Take 1  tablet (500 mg total) by mouth 2 (two) times daily with a meal.  Dispense: 60 tablet; Refill: 0  Prediabetes Continue 2200 calorie 140 gram protein meal plan, limit simple carbohydrates Continue Metformin  XR 500 mg BID and monitor for side effects Continue exercise with current goal of 240 minutes of moderate to high intensity exercise/week with strength training. -     metFORMIN  HCl ER; Take 1 tablet (500 mg total) by mouth 2 (two) times daily with a meal.  Dispense: 60 tablet; Refill: 0  Vitamin D  deficiency Supplement with Ergocalciferol  50000 units once a week  Low vitamin D  levels can be associated with adiposity and may result in leptin resistance and weight gain. Also associated with fatigue.  Currently on vitamin D  supplementation without any adverse effects such as nausea, vomiting or muscle weakness.   -     Vitamin D  (Ergocalciferol ); Take 1 capsule (50,000 Units total) by mouth every 7 (seven) days.  Dispense: 5 capsule; Refill: 0  Essential hypertension Continue losartan/hydrochlorothiazide 100/25 mg every day Continue Category 2200 calorie 140 gram protein meal plan and DASH diet Monitor BP and if consistently >140/90 notify PCP If develops headaches, chest pain, shortness of breath or dizziness go to ER Continue to follow regularly with PCP  Class 3 severe obesity with serious comorbidity and body mass index (BMI) of 40.0 to 44.9 in adult, unspecified obesity type (HCC) See plan above -     Phentermine  HCl; Take 1 tablet (37.5 mg total) by mouth daily before breakfast. Take 1/2 to 1 tablet every morning for dieting & weightloss  Dispense: 30 tablet; Refill: 0         Return in about 6 weeks (around 11/24/2024).SABRA He needs to space out his appointment due to cost with his high deductible plan. He was informed of the importance of frequent follow up visits to maximize his success with intensive lifestyle modifications for his multiple health conditions.   ATTESTASTION  STATEMENTS:  Reviewed by clinician on day of visit: allergies, medications, problem list, medical history, surgical history, family history, social history, and previous encounter notes.     Joe Snyder ANP-C "

## 2024-11-24 ENCOUNTER — Ambulatory Visit (INDEPENDENT_AMBULATORY_CARE_PROVIDER_SITE_OTHER): Admitting: Nurse Practitioner
